# Patient Record
Sex: Female | Born: 1980 | Race: Black or African American | Hispanic: No | Marital: Single | Smoking: Never smoker
Health system: Southern US, Community
[De-identification: ages and names within clinical notes are randomized; demographics above are authoritative.]

## PROBLEM LIST (undated history)

## (undated) ENCOUNTER — Inpatient Hospital Stay (HOSPITAL_COMMUNITY): Payer: Self-pay

## (undated) DIAGNOSIS — F329 Major depressive disorder, single episode, unspecified: Secondary | ICD-10-CM

## (undated) DIAGNOSIS — M722 Plantar fascial fibromatosis: Secondary | ICD-10-CM

## (undated) DIAGNOSIS — Z9889 Other specified postprocedural states: Secondary | ICD-10-CM

## (undated) DIAGNOSIS — M549 Dorsalgia, unspecified: Secondary | ICD-10-CM

## (undated) DIAGNOSIS — T7840XA Allergy, unspecified, initial encounter: Secondary | ICD-10-CM

## (undated) DIAGNOSIS — F32A Depression, unspecified: Secondary | ICD-10-CM

## (undated) DIAGNOSIS — H9191 Unspecified hearing loss, right ear: Secondary | ICD-10-CM

## (undated) DIAGNOSIS — K469 Unspecified abdominal hernia without obstruction or gangrene: Secondary | ICD-10-CM

## (undated) DIAGNOSIS — E669 Obesity, unspecified: Secondary | ICD-10-CM

## (undated) DIAGNOSIS — G4730 Sleep apnea, unspecified: Secondary | ICD-10-CM

## (undated) DIAGNOSIS — R112 Nausea with vomiting, unspecified: Secondary | ICD-10-CM

## (undated) DIAGNOSIS — O24419 Gestational diabetes mellitus in pregnancy, unspecified control: Secondary | ICD-10-CM

## (undated) DIAGNOSIS — E119 Type 2 diabetes mellitus without complications: Secondary | ICD-10-CM

## (undated) DIAGNOSIS — K5900 Constipation, unspecified: Secondary | ICD-10-CM

## (undated) DIAGNOSIS — M2550 Pain in unspecified joint: Secondary | ICD-10-CM

## (undated) DIAGNOSIS — I10 Essential (primary) hypertension: Secondary | ICD-10-CM

## (undated) DIAGNOSIS — F419 Anxiety disorder, unspecified: Secondary | ICD-10-CM

## (undated) HISTORY — DX: Constipation, unspecified: K59.00

## (undated) HISTORY — DX: Type 2 diabetes mellitus without complications: E11.9

## (undated) HISTORY — DX: Depression, unspecified: F32.A

## (undated) HISTORY — DX: Anxiety disorder, unspecified: F41.9

## (undated) HISTORY — DX: Unspecified hearing loss, right ear: H91.91

## (undated) HISTORY — DX: Allergy, unspecified, initial encounter: T78.40XA

## (undated) HISTORY — DX: Morbid (severe) obesity due to excess calories: E66.01

## (undated) HISTORY — DX: Plantar fascial fibromatosis: M72.2

## (undated) HISTORY — PX: HERNIA REPAIR: SHX51

## (undated) HISTORY — DX: Major depressive disorder, single episode, unspecified: F32.9

## (undated) HISTORY — DX: Sleep apnea, unspecified: G47.30

## (undated) HISTORY — DX: Dorsalgia, unspecified: M54.9

## (undated) HISTORY — DX: Pain in unspecified joint: M25.50

## (undated) HISTORY — DX: Essential (primary) hypertension: I10

## (undated) HISTORY — DX: Obesity, unspecified: E66.9

## (undated) HISTORY — PX: ABDOMINAL HERNIA REPAIR: SHX539

---

## 2004-05-30 ENCOUNTER — Emergency Department (HOSPITAL_COMMUNITY): Admission: EM | Admit: 2004-05-30 | Discharge: 2004-05-30 | Payer: Self-pay | Admitting: Emergency Medicine

## 2004-06-01 ENCOUNTER — Emergency Department (HOSPITAL_COMMUNITY): Admission: EM | Admit: 2004-06-01 | Discharge: 2004-06-01 | Payer: Self-pay | Admitting: Family Medicine

## 2004-10-22 ENCOUNTER — Emergency Department (HOSPITAL_COMMUNITY): Admission: EM | Admit: 2004-10-22 | Discharge: 2004-10-22 | Payer: Self-pay | Admitting: Family Medicine

## 2006-11-12 ENCOUNTER — Inpatient Hospital Stay (HOSPITAL_COMMUNITY): Admission: AD | Admit: 2006-11-12 | Discharge: 2006-11-12 | Payer: Self-pay | Admitting: Obstetrics and Gynecology

## 2010-06-07 ENCOUNTER — Ambulatory Visit: Payer: Self-pay | Admitting: Nurse Practitioner

## 2010-06-07 ENCOUNTER — Inpatient Hospital Stay (HOSPITAL_COMMUNITY)
Admission: AD | Admit: 2010-06-07 | Discharge: 2010-06-07 | Payer: Self-pay | Source: Home / Self Care | Admitting: Obstetrics & Gynecology

## 2010-06-07 DIAGNOSIS — R109 Unspecified abdominal pain: Secondary | ICD-10-CM

## 2010-06-07 DIAGNOSIS — O2390 Unspecified genitourinary tract infection in pregnancy, unspecified trimester: Secondary | ICD-10-CM

## 2010-06-07 DIAGNOSIS — N390 Urinary tract infection, site not specified: Secondary | ICD-10-CM

## 2010-06-14 ENCOUNTER — Ambulatory Visit: Payer: Self-pay | Admitting: Obstetrics & Gynecology

## 2010-06-15 ENCOUNTER — Encounter: Payer: Self-pay | Admitting: Obstetrics and Gynecology

## 2010-06-15 LAB — CONVERTED CEMR LAB
Antibody Screen: NEGATIVE
Basophils Absolute: 0 10*3/uL (ref 0.0–0.1)
Basophils Relative: 0 % (ref 0–1)
Bilirubin Urine: NEGATIVE
Eosinophils Absolute: 0.1 10*3/uL (ref 0.0–0.7)
Eosinophils Relative: 1 % (ref 0–5)
HCT: 36 % (ref 36.0–46.0)
Hemoglobin, Urine: NEGATIVE
Hemoglobin: 11.7 g/dL — ABNORMAL LOW (ref 12.0–15.0)
Hepatitis B Surface Ag: NEGATIVE
Hgb A2 Quant: 2.8 % (ref 2.2–3.2)
Hgb A: 97.2 % (ref 96.8–97.8)
Hgb F Quant: 0 % (ref 0.0–2.0)
Hgb S Quant: 0 % (ref 0.0–0.0)
Ketones, ur: NEGATIVE mg/dL
Leukocytes, UA: NEGATIVE
Lymphocytes Relative: 16 % (ref 12–46)
Lymphs Abs: 3.1 10*3/uL (ref 0.7–4.0)
MCHC: 32.5 g/dL (ref 30.0–36.0)
MCV: 94.2 fL (ref 78.0–100.0)
Monocytes Absolute: 0.9 10*3/uL (ref 0.1–1.0)
Monocytes Relative: 5 % (ref 3–12)
Neutro Abs: 16.1 10*3/uL — ABNORMAL HIGH (ref 1.7–7.7)
Neutrophils Relative %: 79 % — ABNORMAL HIGH (ref 43–77)
Nitrite: NEGATIVE
Platelets: 330 10*3/uL (ref 150–400)
Protein, ur: NEGATIVE mg/dL
RBC: 3.82 M/uL — ABNORMAL LOW (ref 3.87–5.11)
RDW: 14.3 % (ref 11.5–15.5)
Rh Type: POSITIVE
Rubella: 207 intl units/mL — ABNORMAL HIGH
Specific Gravity, Urine: 1.023 (ref 1.005–1.030)
Urine Glucose: NEGATIVE mg/dL
Urobilinogen, UA: 0.2 (ref 0.0–1.0)
WBC: 20.3 10*3/uL — ABNORMAL HIGH (ref 4.0–10.5)
pH: 7 (ref 5.0–8.0)

## 2010-07-02 ENCOUNTER — Ambulatory Visit: Payer: Self-pay | Admitting: Physician Assistant

## 2010-07-30 ENCOUNTER — Ambulatory Visit: Payer: Self-pay | Admitting: Physician Assistant

## 2010-08-16 ENCOUNTER — Ambulatory Visit (HOSPITAL_COMMUNITY)
Admission: RE | Admit: 2010-08-16 | Discharge: 2010-08-16 | Payer: Self-pay | Source: Home / Self Care | Admitting: Obstetrics & Gynecology

## 2010-08-27 ENCOUNTER — Ambulatory Visit: Payer: Self-pay | Admitting: Obstetrics and Gynecology

## 2010-08-30 ENCOUNTER — Ambulatory Visit (HOSPITAL_COMMUNITY)
Admission: RE | Admit: 2010-08-30 | Discharge: 2010-08-30 | Payer: Self-pay | Source: Home / Self Care | Admitting: Family Medicine

## 2010-09-14 ENCOUNTER — Inpatient Hospital Stay (HOSPITAL_COMMUNITY)
Admission: AD | Admit: 2010-09-14 | Discharge: 2010-09-15 | Payer: Self-pay | Source: Home / Self Care | Attending: Family Medicine | Admitting: Family Medicine

## 2010-09-20 ENCOUNTER — Inpatient Hospital Stay (HOSPITAL_COMMUNITY)
Admission: AD | Admit: 2010-09-20 | Discharge: 2010-09-20 | Payer: Self-pay | Source: Home / Self Care | Attending: Obstetrics and Gynecology | Admitting: Obstetrics and Gynecology

## 2010-09-24 ENCOUNTER — Ambulatory Visit: Payer: Self-pay | Admitting: Obstetrics and Gynecology

## 2010-09-24 ENCOUNTER — Encounter: Payer: Self-pay | Admitting: Obstetrics and Gynecology

## 2010-10-01 ENCOUNTER — Encounter: Payer: Self-pay | Admitting: Obstetrics and Gynecology

## 2010-10-01 ENCOUNTER — Inpatient Hospital Stay (HOSPITAL_COMMUNITY)
Admission: AD | Admit: 2010-10-01 | Discharge: 2010-10-01 | Payer: Self-pay | Source: Home / Self Care | Attending: Family Medicine | Admitting: Family Medicine

## 2010-10-04 ENCOUNTER — Ambulatory Visit
Admission: RE | Admit: 2010-10-04 | Discharge: 2010-10-04 | Payer: Self-pay | Source: Home / Self Care | Attending: Physician Assistant | Admitting: Physician Assistant

## 2010-10-06 ENCOUNTER — Ambulatory Visit (HOSPITAL_COMMUNITY)
Admission: RE | Admit: 2010-10-06 | Discharge: 2010-10-06 | Payer: Self-pay | Source: Home / Self Care | Attending: Obstetrics & Gynecology | Admitting: Obstetrics & Gynecology

## 2010-10-11 ENCOUNTER — Encounter
Admission: RE | Admit: 2010-10-11 | Discharge: 2010-10-26 | Payer: Self-pay | Source: Home / Self Care | Attending: Obstetrics & Gynecology | Admitting: Obstetrics & Gynecology

## 2010-10-11 LAB — URINALYSIS, ROUTINE W REFLEX MICROSCOPIC
Bilirubin Urine: NEGATIVE
Hgb urine dipstick: NEGATIVE
Ketones, ur: NEGATIVE mg/dL
Nitrite: NEGATIVE
Protein, ur: NEGATIVE mg/dL
Specific Gravity, Urine: >1.03 — ABNORMAL HIGH (ref 1.005–1.030)
Urine Glucose, Fasting: NEGATIVE mg/dL
Urobilinogen, UA: 0.2 mg/dL (ref 0.0–1.0)
pH: 6 (ref 5.0–8.0)

## 2010-10-22 ENCOUNTER — Ambulatory Visit
Admission: RE | Admit: 2010-10-22 | Discharge: 2010-10-22 | Payer: Self-pay | Source: Home / Self Care | Attending: Family | Admitting: Family

## 2010-11-05 ENCOUNTER — Ambulatory Visit: Payer: Medicaid Other | Admitting: Physical Therapy

## 2010-11-05 ENCOUNTER — Encounter: Payer: Self-pay | Admitting: Family

## 2010-11-05 DIAGNOSIS — Z3480 Encounter for supervision of other normal pregnancy, unspecified trimester: Secondary | ICD-10-CM

## 2010-11-05 LAB — CONVERTED CEMR LAB
HCT: 32.2 % — ABNORMAL LOW (ref 36.0–46.0)
HIV: NONREACTIVE
Hemoglobin: 10.5 g/dL — ABNORMAL LOW (ref 12.0–15.0)
MCHC: 32.6 g/dL (ref 30.0–36.0)
MCV: 93.3 fL (ref 78.0–100.0)
Platelets: 239 10*3/uL (ref 150–400)
RBC: 3.45 M/uL — ABNORMAL LOW (ref 3.87–5.11)
RDW: 13.8 % (ref 11.5–15.5)
WBC: 17.2 10*3/uL — ABNORMAL HIGH (ref 4.0–10.5)

## 2010-11-08 ENCOUNTER — Ambulatory Visit: Payer: Medicaid Other | Attending: Obstetrics & Gynecology | Admitting: Physical Therapy

## 2010-11-08 ENCOUNTER — Encounter: Payer: Medicaid Other | Admitting: Physical Therapy

## 2010-11-08 DIAGNOSIS — M25559 Pain in unspecified hip: Secondary | ICD-10-CM | POA: Insufficient documentation

## 2010-11-08 DIAGNOSIS — IMO0001 Reserved for inherently not codable concepts without codable children: Secondary | ICD-10-CM | POA: Insufficient documentation

## 2010-11-08 DIAGNOSIS — M6281 Muscle weakness (generalized): Secondary | ICD-10-CM | POA: Insufficient documentation

## 2010-11-08 DIAGNOSIS — R262 Difficulty in walking, not elsewhere classified: Secondary | ICD-10-CM | POA: Insufficient documentation

## 2010-11-19 ENCOUNTER — Other Ambulatory Visit: Payer: Self-pay | Admitting: Obstetrics & Gynecology

## 2010-11-19 DIAGNOSIS — IMO0002 Reserved for concepts with insufficient information to code with codable children: Secondary | ICD-10-CM

## 2010-11-19 DIAGNOSIS — Z3689 Encounter for other specified antenatal screening: Secondary | ICD-10-CM

## 2010-11-19 DIAGNOSIS — Z3480 Encounter for supervision of other normal pregnancy, unspecified trimester: Secondary | ICD-10-CM

## 2010-11-22 ENCOUNTER — Ambulatory Visit (HOSPITAL_COMMUNITY): Payer: Medicaid Other

## 2010-11-22 ENCOUNTER — Encounter (HOSPITAL_COMMUNITY): Payer: Self-pay

## 2010-11-22 ENCOUNTER — Ambulatory Visit (HOSPITAL_COMMUNITY)
Admission: RE | Admit: 2010-11-22 | Discharge: 2010-11-22 | Disposition: A | Discharge status: Home / Self Care | Payer: Medicaid Other | Source: Ambulatory Visit | Attending: Obstetrics & Gynecology | Admitting: Obstetrics & Gynecology

## 2010-11-22 DIAGNOSIS — E669 Obesity, unspecified: Secondary | ICD-10-CM | POA: Insufficient documentation

## 2010-11-22 DIAGNOSIS — Z3689 Encounter for other specified antenatal screening: Secondary | ICD-10-CM

## 2010-11-22 DIAGNOSIS — O99210 Obesity complicating pregnancy, unspecified trimester: Secondary | ICD-10-CM | POA: Insufficient documentation

## 2010-11-22 DIAGNOSIS — O3660X0 Maternal care for excessive fetal growth, unspecified trimester, not applicable or unspecified: Secondary | ICD-10-CM | POA: Insufficient documentation

## 2010-11-26 ENCOUNTER — Encounter: Payer: Medicaid Other | Attending: Obstetrics & Gynecology | Admitting: Dietician

## 2010-11-26 DIAGNOSIS — Z713 Dietary counseling and surveillance: Secondary | ICD-10-CM | POA: Insufficient documentation

## 2010-12-03 DIAGNOSIS — R7309 Other abnormal glucose: Secondary | ICD-10-CM

## 2010-12-03 DIAGNOSIS — Z3480 Encounter for supervision of other normal pregnancy, unspecified trimester: Secondary | ICD-10-CM

## 2010-12-03 DIAGNOSIS — IMO0002 Reserved for concepts with insufficient information to code with codable children: Secondary | ICD-10-CM

## 2010-12-03 DIAGNOSIS — O3440 Maternal care for other abnormalities of cervix, unspecified trimester: Secondary | ICD-10-CM

## 2010-12-06 LAB — URINALYSIS, ROUTINE W REFLEX MICROSCOPIC
Bilirubin Urine: NEGATIVE
Glucose, UA: NEGATIVE mg/dL
Hgb urine dipstick: NEGATIVE
Ketones, ur: NEGATIVE mg/dL
Nitrite: NEGATIVE
Protein, ur: NEGATIVE mg/dL
Specific Gravity, Urine: >1.03 — ABNORMAL HIGH (ref 1.005–1.030)
Urobilinogen, UA: 1 mg/dL (ref 0.0–1.0)
pH: 6 (ref 5.0–8.0)

## 2010-12-09 LAB — URINALYSIS, ROUTINE W REFLEX MICROSCOPIC
Bilirubin Urine: NEGATIVE
Glucose, UA: NEGATIVE mg/dL
Hgb urine dipstick: NEGATIVE
Ketones, ur: NEGATIVE mg/dL
Nitrite: NEGATIVE
Protein, ur: NEGATIVE mg/dL
Specific Gravity, Urine: >1.03 — ABNORMAL HIGH (ref 1.005–1.030)
Urobilinogen, UA: 0.2 mg/dL (ref 0.0–1.0)
pH: 6 (ref 5.0–8.0)

## 2010-12-09 LAB — CBC
HCT: 36.8 % (ref 36.0–46.0)
Hemoglobin: 12.2 g/dL (ref 12.0–15.0)
MCH: 31.5 pg (ref 26.0–34.0)
MCHC: 33.2 g/dL (ref 30.0–36.0)
MCV: 94.8 fL (ref 78.0–100.0)
Platelets: 304 10*3/uL (ref 150–400)
RBC: 3.88 MIL/uL (ref 3.87–5.11)
RDW: 13.4 % (ref 11.5–15.5)
WBC: 21.1 10*3/uL — ABNORMAL HIGH (ref 4.0–10.5)

## 2010-12-09 LAB — WET PREP, GENITAL
Trich, Wet Prep: NONE SEEN
Yeast Wet Prep HPF POC: NONE SEEN

## 2010-12-09 LAB — DIFFERENTIAL
Basophils Absolute: 0 10*3/uL (ref 0.0–0.1)
Basophils Relative: 0 % (ref 0–1)
Eosinophils Absolute: 0.1 10*3/uL (ref 0.0–0.7)
Eosinophils Relative: 1 % (ref 0–5)
Lymphocytes Relative: 17 % (ref 12–46)
Lymphs Abs: 3.5 10*3/uL (ref 0.7–4.0)
Monocytes Absolute: 1.4 10*3/uL — ABNORMAL HIGH (ref 0.1–1.0)
Monocytes Relative: 7 % (ref 3–12)
Neutro Abs: 15.9 10*3/uL — ABNORMAL HIGH (ref 1.7–7.7)
Neutrophils Relative %: 76 % (ref 43–77)

## 2010-12-09 LAB — URINE CULTURE
Colony Count: 85000
Culture  Setup Time: 201109130217

## 2010-12-09 LAB — HCG, QUANTITATIVE, PREGNANCY: hCG, Beta Chain, Quant, S: 81478 m[IU]/mL — ABNORMAL HIGH (ref <5)

## 2010-12-09 LAB — POCT PREGNANCY, URINE: Preg Test, Ur: POSITIVE

## 2010-12-09 LAB — URINE MICROSCOPIC-ADD ON

## 2010-12-09 LAB — GC/CHLAMYDIA PROBE AMP, GENITAL
Chlamydia, DNA Probe: NEGATIVE
GC Probe Amp, Genital: NEGATIVE

## 2010-12-17 ENCOUNTER — Ambulatory Visit (HOSPITAL_BASED_OUTPATIENT_CLINIC_OR_DEPARTMENT_OTHER)
Admission: RE | Admit: 2010-12-17 | Discharge: 2010-12-17 | Disposition: A | Discharge status: Home / Self Care | Payer: Medicaid Other | Source: Ambulatory Visit | Attending: Obstetrics & Gynecology | Admitting: Obstetrics & Gynecology

## 2010-12-17 ENCOUNTER — Other Ambulatory Visit: Payer: Self-pay | Admitting: Obstetrics & Gynecology

## 2010-12-17 ENCOUNTER — Encounter: Payer: Self-pay | Admitting: Family

## 2010-12-17 DIAGNOSIS — R7309 Other abnormal glucose: Secondary | ICD-10-CM

## 2010-12-17 DIAGNOSIS — M7989 Other specified soft tissue disorders: Secondary | ICD-10-CM

## 2010-12-17 DIAGNOSIS — O99891 Other specified diseases and conditions complicating pregnancy: Secondary | ICD-10-CM | POA: Insufficient documentation

## 2010-12-17 DIAGNOSIS — R252 Cramp and spasm: Secondary | ICD-10-CM | POA: Insufficient documentation

## 2010-12-17 DIAGNOSIS — Z3480 Encounter for supervision of other normal pregnancy, unspecified trimester: Secondary | ICD-10-CM

## 2010-12-17 DIAGNOSIS — Z331 Pregnant state, incidental: Secondary | ICD-10-CM

## 2010-12-17 DIAGNOSIS — M79604 Pain in right leg: Secondary | ICD-10-CM

## 2010-12-17 DIAGNOSIS — M79609 Pain in unspecified limb: Secondary | ICD-10-CM

## 2010-12-17 DIAGNOSIS — I82409 Acute embolism and thrombosis of unspecified deep veins of unspecified lower extremity: Secondary | ICD-10-CM

## 2010-12-17 LAB — CONVERTED CEMR LAB
Chlamydia, Swab/Urine, PCR: NEGATIVE
GC Probe Amp, Urine: NEGATIVE

## 2010-12-18 ENCOUNTER — Encounter: Payer: Self-pay | Admitting: Family

## 2010-12-24 ENCOUNTER — Other Ambulatory Visit: Payer: Self-pay | Admitting: Obstetrics & Gynecology

## 2010-12-24 DIAGNOSIS — Z3480 Encounter for supervision of other normal pregnancy, unspecified trimester: Secondary | ICD-10-CM

## 2010-12-24 DIAGNOSIS — O26849 Uterine size-date discrepancy, unspecified trimester: Secondary | ICD-10-CM

## 2010-12-28 ENCOUNTER — Ambulatory Visit (HOSPITAL_COMMUNITY)
Admission: RE | Admit: 2010-12-28 | Discharge: 2010-12-28 | Disposition: A | Discharge status: Home / Self Care | Payer: Medicaid Other | Source: Ambulatory Visit | Attending: Obstetrics & Gynecology | Admitting: Obstetrics & Gynecology

## 2010-12-28 DIAGNOSIS — O3660X0 Maternal care for excessive fetal growth, unspecified trimester, not applicable or unspecified: Secondary | ICD-10-CM | POA: Insufficient documentation

## 2010-12-28 DIAGNOSIS — E669 Obesity, unspecified: Secondary | ICD-10-CM | POA: Insufficient documentation

## 2010-12-28 DIAGNOSIS — O99210 Obesity complicating pregnancy, unspecified trimester: Secondary | ICD-10-CM | POA: Insufficient documentation

## 2010-12-28 DIAGNOSIS — O26849 Uterine size-date discrepancy, unspecified trimester: Secondary | ICD-10-CM

## 2011-01-02 ENCOUNTER — Inpatient Hospital Stay (HOSPITAL_COMMUNITY)
Admission: AD | Admit: 2011-01-02 | Discharge: 2011-01-02 | Disposition: A | Discharge status: Home / Self Care | Payer: Medicaid Other | Source: Ambulatory Visit | Attending: Obstetrics & Gynecology | Admitting: Obstetrics & Gynecology

## 2011-01-02 DIAGNOSIS — O99891 Other specified diseases and conditions complicating pregnancy: Secondary | ICD-10-CM | POA: Insufficient documentation

## 2011-01-03 ENCOUNTER — Other Ambulatory Visit: Payer: Self-pay | Admitting: Obstetrics & Gynecology

## 2011-01-03 DIAGNOSIS — Z3480 Encounter for supervision of other normal pregnancy, unspecified trimester: Secondary | ICD-10-CM

## 2011-01-03 DIAGNOSIS — O26849 Uterine size-date discrepancy, unspecified trimester: Secondary | ICD-10-CM

## 2011-01-03 DIAGNOSIS — IMO0002 Reserved for concepts with insufficient information to code with codable children: Secondary | ICD-10-CM

## 2011-01-03 DIAGNOSIS — R7309 Other abnormal glucose: Secondary | ICD-10-CM

## 2011-01-10 DIAGNOSIS — IMO0002 Reserved for concepts with insufficient information to code with codable children: Secondary | ICD-10-CM

## 2011-01-10 DIAGNOSIS — R7309 Other abnormal glucose: Secondary | ICD-10-CM

## 2011-01-10 DIAGNOSIS — Z3480 Encounter for supervision of other normal pregnancy, unspecified trimester: Secondary | ICD-10-CM

## 2011-01-12 ENCOUNTER — Ambulatory Visit (HOSPITAL_COMMUNITY): Payer: Medicaid Other

## 2011-01-13 ENCOUNTER — Inpatient Hospital Stay (HOSPITAL_COMMUNITY): Payer: Medicaid Other

## 2011-01-13 ENCOUNTER — Inpatient Hospital Stay (HOSPITAL_COMMUNITY)
Admission: AD | Admit: 2011-01-13 | Discharge: 2011-01-17 | DRG: 765 | Disposition: A | Discharge status: Home / Self Care | Payer: Medicaid Other | Source: Ambulatory Visit | Attending: Obstetrics & Gynecology | Admitting: Obstetrics & Gynecology

## 2011-01-13 ENCOUNTER — Other Ambulatory Visit (HOSPITAL_COMMUNITY): Payer: Medicaid Other

## 2011-01-13 DIAGNOSIS — O411090 Infection of amniotic sac and membranes, unspecified, unspecified trimester, not applicable or unspecified: Secondary | ICD-10-CM | POA: Diagnosis present

## 2011-01-13 DIAGNOSIS — O4290 Premature rupture of membranes, unspecified as to length of time between rupture and onset of labor, unspecified weeks of gestation: Principal | ICD-10-CM | POA: Diagnosis present

## 2011-01-13 LAB — CBC
MCH: 30.8 pg (ref 26.0–34.0)
MCHC: 33.1 g/dL (ref 30.0–36.0)
MCV: 93 fL (ref 78.0–100.0)
Platelets: 207 10*3/uL (ref 150–400)
RBC: 4 MIL/uL (ref 3.87–5.11)
RDW: 14.6 % (ref 11.5–15.5)

## 2011-01-13 LAB — RPR: RPR Ser Ql: NONREACTIVE

## 2011-01-14 DIAGNOSIS — O411090 Infection of amniotic sac and membranes, unspecified, unspecified trimester, not applicable or unspecified: Secondary | ICD-10-CM

## 2011-01-14 DIAGNOSIS — O4290 Premature rupture of membranes, unspecified as to length of time between rupture and onset of labor, unspecified weeks of gestation: Secondary | ICD-10-CM

## 2011-01-15 LAB — CBC
Hemoglobin: 9.9 g/dL — ABNORMAL LOW (ref 12.0–15.0)
MCH: 29.9 pg (ref 26.0–34.0)
MCHC: 32.1 g/dL (ref 30.0–36.0)
RDW: 14.7 % (ref 11.5–15.5)

## 2011-01-16 LAB — CREATININE, SERUM: GFR calc Af Amer: >60 mL/min (ref >60)

## 2011-01-18 ENCOUNTER — Ambulatory Visit (HOSPITAL_COMMUNITY): Payer: Medicaid Other

## 2011-01-19 NOTE — Op Note (Addendum)
NAME:  [name], [name]              ACCOUNT NO.:  [id number]  MEDICAL RECORD NO.:  [id number]           PATIENT TYPE:  I  LOCATION:  9101                          FACILITY:  [facility]  PHYSICIAN:  Catalina Antigua, MD     DATE OF BIRTH:  31-Jan-1981  DATE OF PROCEDURE:  01/14/2011 DATE OF DISCHARGE:                              OPERATIVE REPORT   PREOPERATIVE DIAGNOSIS:  This is a 30 year old gravida 1, para 0 at 73 and 3 who had a premature prolonged rupture of membranes greater than 24 hours and noted to have protracted labor.  She had a tracing category 2 with late decels resolved with IV hydration, maternal repositioning, and stopping Pitocin.  Delivery via C-section was discussed in view of category 2 tracing and protracted labor and the patient agreed with proceeding.  POSTOPERATIVE DIAGNOSES:  This is a 30 year old gravida 1, para 0 at 57 and 3 who had a premature prolonged rupture of membranes greater than 24 hours and noted to have protracted labor.  She had a tracing category 2 with late decels resolved with IV hydration, maternal repositioning, and stopping Pitocin.  Delivery via C-section was discussed in view of category 2 tracing and protracted labor and the patient agreed with proceeding.  PROCEDURE:  Primary low transverse cesarean section.  SURGEON:  Dr. Catalina Antigua, Dr. Lucina Mellow.  ASSISTANT:  None.  ANESTHESIA:  Epidural.  COMPLICATIONS:  None.  ESTIMATED BLOOD LOSS:  800 mL.  FLUIDS:  2500 mL.  URINE OUTPUT:  150 mL and clear.  FINDINGS:  Viable infant female in cephalic presentation in acyclinic position, Apgar's of 9 and 9, 8 pounds and 8 ounces with delayed, normal placenta, uterus, ovaries, and fallopian tubes.  PROCEDURE:  The patient was taken to the operating room where epidural anesthesia was found to be adequate.  She was prepared and draped in normal sterile fashion in the dorsal supine position with a leftward tilt.  A Pfannenstiel soft  skin incision was then made with a scalpel and carried through to the underlying fascia with the Bovie and the knife.  The fascia was incised in the midline and the excision was extended laterally with Mayo scissors.  The superior aspect of the incision was grasped with Kocher clamps, elevated, and the underlying rectus muscles were dissected off bluntly.  Attention was then turned to the inferior aspect of the incision which in similar fashion was grasped, tented up with Kocher clamps, and rectus muscles were dissected off bluntly.  The rectus muscles were separated in the midline.  The peritoneum was identified and entered sharply and bluntly with fingers and hemostats.  The peritoneal incision and rectus muscles were then separated with strong lateral traction with good visualization of the uterus.  The Alexis retractor was placed and the lower uterine segment was incised in a transverse fashion with the scalpel.  It was then extended in an upward lateral fashion with bandage scissors.  The infant's head was delivered atraumatically.  The nose and mouth were suctioned on the field and the cord was clamped and cut.  Cord blood gas was attempted to be obtained but  could not be obtained and this was stopped because the infant was crying strong.  Cord blood sample was obtained as well as the cord blood donation samples obtained and handed off.  The placenta delivered spontaneously and was passed off and was sent to labor and delivery.  The uterus was cleared of all clots and debris and uterus was repaired with Vicryl in a running locked fashion.  The second layer of the same suture in imbricating fashion was used to obtain excellent hemostasis. The abdomen was irrigated and was checked for any bleeding which was not noted.  The peritoneum was then closed with 3-0 Vicryl in a running stitch.  The fascia was reapproximated with 0 Vicryl in a running fashion.  The skin was closed with  staples.  The patient tolerated the procedure well.  Sponge, lap, and needle counts were correct x2.  The patient may be given amp and gent during labor for chorioamnionitis and was given Ancef prior to the procedure.  The patient was taken to the recovery room in stable condition.    ______________________________ Lucina Mellow, DO   ______________________________ Catalina Antigua, MD    SH/MEDQ  D:  01/14/2011  T:  01/15/2011  Job:  [id number]  Electronically Signed by Lucina Mellow MD on 01/19/2011 03:58:23 PM Electronically Signed by Catalina Antigua  on 01/19/2011 05:07:30 PM

## 2011-01-21 ENCOUNTER — Encounter (INDEPENDENT_AMBULATORY_CARE_PROVIDER_SITE_OTHER): Payer: Medicaid Other

## 2011-01-21 DIAGNOSIS — Z09 Encounter for follow-up examination after completed treatment for conditions other than malignant neoplasm: Secondary | ICD-10-CM

## 2011-01-24 ENCOUNTER — Ambulatory Visit (HOSPITAL_COMMUNITY): Payer: Medicaid Other

## 2011-02-08 NOTE — Discharge Summary (Signed)
  [name], [name]              ACCOUNT NO.:  [id number]  MEDICAL RECORD NO.:  [id number]           PATIENT TYPE:  I  LOCATION:  9101                          [facility]  [facility]  PHYSICIAN:  Catalina Antigua, MD     DATE OF BIRTH:  1981-03-30  DATE OF ADMISSION:  01/13/2011 DATE OF DISCHARGE:  01/17/2011                              DISCHARGE SUMMARY   REASON FOR ADMISSION:  Pregnancy at term and early labor.  PROCEDURE:  The patient had a low transverse cesarean section for arrest of dilatation and descent plus nonreassuring fetal heart rate tones with chorioamnionitis, premature rupture of membranes greater than 36 hours, by Dr. Jolayne Panther and Natale Milch, which produced a viable female infant with Apgars of 9 and 9 weighing 8 pounds 8 ounces.  POSTOPERATIVE DIAGNOSIS:  The patient had a low transverse cesarean section for arrest of dilatation and descent plus nonreassuring fetal heart rate tones with chorioamnionitis, premature rupture of membranes greater than 36 hours, by Dr. Jolayne Panther and Natale Milch, which produced a viable female infant with Apgars of 9 and 9 weighing 8 pounds 8 ounces.  HOSPITAL COURSE:  This has been uneventful.  The patient is up ambulating well, taking p.o. fluids and solids well.  She responded well to the IV therapy.  Prior to delivery in regard to maternal temp with chorioamnionitis, IV antibiotics were continued for 24 hours post delivery.  The patient has remained afebrile.  Discharge diet as tolerated.  ACTIVITY LEVEL:  No heavy lifting or driving for 2 weeks.  FOLLOWUP:  She is to follow up at [facility] on Friday for staple removal and blood pressure check.  PHYSICAL EXAMINATION TODAY:  VITAL SIGNS:  Stable. HEART:  Regular rhythm and rate. LUNGS:  Clear to auscultation bilaterally. ABDOMEN:  Soft.  Bowel sounds present in all four quadrants.  Incision is intact.  There is no redness, swelling, or drainage.  Trace edema. Fundus is  firm. GENITOURINARY:  Lochia is small amount.  ASSESSMENT:  Stable postop day 3.  DISCHARGE MEDICATIONS: 1. FeSO4 - 325 one p.o. q.a.m. 2. Prenatal vitamin one daily. 3. Percocet 5/325 one p.o. q.4 h. pain. 4. Motrin 600 p.o. q.6 h. p.r.n. cramping.  Discharge hemoglobin is 10.  Discharge hematocrit is 31.     Zerita Boers, N.M.   ______________________________ Catalina Antigua, MD    DL/MEDQ  D:  16/06/9603  T:  01/17/2011  Job:  [id number]  cc:   [facility]  Electronically Signed by Wyvonnia Dusky N.M. on 01/30/2011 08:59:15 AM Electronically Signed by Catalina Antigua  on 02/08/2011 06:24:37 PM

## 2011-02-24 ENCOUNTER — Ambulatory Visit (HOSPITAL_COMMUNITY)
Admission: RE | Admit: 2011-02-24 | Discharge: 2011-02-24 | Disposition: A | Discharge status: Home / Self Care | Payer: Medicaid Other | Source: Ambulatory Visit | Attending: Obstetrics & Gynecology | Admitting: Obstetrics & Gynecology

## 2011-02-25 ENCOUNTER — Ambulatory Visit (INDEPENDENT_AMBULATORY_CARE_PROVIDER_SITE_OTHER): Payer: Medicaid Other

## 2011-02-25 DIAGNOSIS — Z3049 Encounter for surveillance of other contraceptives: Secondary | ICD-10-CM

## 2011-03-02 ENCOUNTER — Ambulatory Visit (HOSPITAL_COMMUNITY)
Admission: RE | Admit: 2011-03-02 | Discharge: 2011-03-02 | Disposition: A | Discharge status: Home / Self Care | Payer: Medicaid Other | Source: Ambulatory Visit | Attending: Obstetrics & Gynecology | Admitting: Obstetrics & Gynecology

## 2011-03-02 DIAGNOSIS — O925 Suppressed lactation: Secondary | ICD-10-CM | POA: Insufficient documentation

## 2011-03-08 ENCOUNTER — Ambulatory Visit (HOSPITAL_COMMUNITY)
Admission: RE | Admit: 2011-03-08 | Discharge: 2011-03-08 | Disposition: A | Discharge status: Home / Self Care | Payer: Medicaid Other | Source: Ambulatory Visit | Attending: Obstetrics & Gynecology | Admitting: Obstetrics & Gynecology

## 2011-03-08 DIAGNOSIS — O925 Suppressed lactation: Secondary | ICD-10-CM | POA: Insufficient documentation

## 2011-03-08 DIAGNOSIS — O923 Agalactia: Secondary | ICD-10-CM | POA: Insufficient documentation

## 2011-03-15 ENCOUNTER — Ambulatory Visit (HOSPITAL_COMMUNITY)
Admission: RE | Admit: 2011-03-15 | Discharge: 2011-03-15 | Disposition: A | Discharge status: Home / Self Care | Payer: Medicaid Other | Source: Ambulatory Visit | Attending: Obstetrics & Gynecology | Admitting: Obstetrics & Gynecology

## 2011-03-29 NOTE — Group Therapy Note (Unsigned)
[name], [name]              ACCOUNT NO.:  [id number]  MEDICAL RECORD NO.:  [id number]           PATIENT TYPE:  O  LOCATION:  [facility]                    [facility]  [facility]  PHYSICIAN:  Georges Mouse, CNM   DATE OF BIRTH:  [birth year]  DATE OF SERVICE:  03/28/2011                                 CLINIC NOTE  REASON FOR VISIT:  6-week postpartum visit.  HISTORY OF PRESENT ILLNESS:  [name] is a G1, P1 here for a 6-week postpartum visit.  She is status post primary C-section on January 14, 2011.  She is without complaints today.  She is bottle and breastfeeding without any problems.  She is requesting Depo-Provera for birth control. She states that her vaginal bleeding has stopped.  She does have ongoing pain in her incision site, which is controlled with ibuprofen.  PAST MEDICAL HISTORY:  No change since history taken at initial prenatal visit on July 02, 2010.  Last Pap smear was October 2011, and it was normal.  MEDICATIONS:  Colace as needed, prenatal vitamins, and ibuprofen as needed.  ALLERGIES:  NKDA.  OBJECTIVE:  VITAL SIGNS:  Pulse 83, blood pressure 114/73, weight 236 pounds, height is 5 feet 6 inches. GENERAL:  Well-appearing, obese female, in no acute distress. ABDOMEN:  Soft and nontender.  C-section, incision well healed with no erythema, induration, or exudate. PELVIS:  Pannus is below symphysis pubis. PSYCH:  Mood appropriate.  A and O x4. BREASTS:  Soft, nontender.  No masses.  No erythema. SKIN:  Intact nipples.  ASSESSMENT:  A 30 year old G1, P1, 6-weeks postpartum, normal postpartum course.  PLAN:  Depo-Provera for birth control was prior to administration. Discussed risks and benefits of Depo as well as other birth control options.  The patient was interested in Implanon, but chose to get a Depo-Provera shot today.  She will follow up and she desires to have Implanon inserted.  Her urine pregnancy test was negative today. Counseled on  breastfeeding.  The patient will follow up for annual exams and repeat Pap smear as indicated.          ______________________________ Georges Mouse, CNM    NF/MEDQ  D:  03/28/2011  T:  03/28/2011  Job:  [id number]

## 2011-04-29 ENCOUNTER — Telehealth: Payer: Self-pay | Admitting: *Deleted

## 2011-04-29 DIAGNOSIS — B3789 Other sites of candidiasis: Secondary | ICD-10-CM

## 2011-04-29 MED ORDER — DICYCLOMINE HCL 10 MG PO CAPS: 10.0000 mg | ORAL_CAPSULE | Freq: Three times a day (TID) | ORAL | Status: DC

## 2011-04-29 NOTE — Telephone Encounter (Signed)
Pt having decreased breast milk production.  Spoke with Maylon Cos, CNM and ordered Bentyl 10mg  #90 one TID with one RF.  This was called to [facility] on [address].  Pt to call with followup of milk supply.

## 2011-05-13 ENCOUNTER — Ambulatory Visit (INDEPENDENT_AMBULATORY_CARE_PROVIDER_SITE_OTHER): Payer: Medicaid Other | Admitting: *Deleted

## 2011-05-13 ENCOUNTER — Encounter: Payer: Self-pay | Admitting: *Deleted

## 2011-05-13 VITALS — BP 121/76 | HR 87 | Temp 98.3°F | Resp 16 | Ht 66.0 in | Wt 246.0 lb

## 2011-05-13 DIAGNOSIS — Z3049 Encounter for surveillance of other contraceptives: Secondary | ICD-10-CM

## 2011-05-13 MED ORDER — MEDROXYPROGESTERONE ACETATE 150 MG/ML IM SUSP
150.0000 mg | Freq: Once | INTRAMUSCULAR | Status: AC
  Administered 2011-05-13: 150 mg via INTRAMUSCULAR

## 2011-05-13 MED ORDER — MEDROXYPROGESTERONE ACETATE 150 MG/ML IM SUSP: 150.0000 mg | Freq: Once | INTRAMUSCULAR | Status: DC

## 2011-06-20 ENCOUNTER — Emergency Department (HOSPITAL_COMMUNITY): Payer: Self-pay

## 2011-06-20 ENCOUNTER — Emergency Department (HOSPITAL_COMMUNITY)
Admission: EM | Admit: 2011-06-20 | Discharge: 2011-06-20 | Disposition: A | Discharge status: Home / Self Care | Payer: Self-pay | Attending: Emergency Medicine | Admitting: Emergency Medicine

## 2011-06-20 DIAGNOSIS — R05 Cough: Secondary | ICD-10-CM | POA: Insufficient documentation

## 2011-06-20 DIAGNOSIS — R059 Cough, unspecified: Secondary | ICD-10-CM | POA: Insufficient documentation

## 2011-06-20 DIAGNOSIS — R0602 Shortness of breath: Secondary | ICD-10-CM | POA: Insufficient documentation

## 2011-06-20 DIAGNOSIS — J40 Bronchitis, not specified as acute or chronic: Secondary | ICD-10-CM | POA: Insufficient documentation

## 2011-07-29 ENCOUNTER — Ambulatory Visit: Payer: Self-pay

## 2011-08-04 ENCOUNTER — Ambulatory Visit (INDEPENDENT_AMBULATORY_CARE_PROVIDER_SITE_OTHER): Payer: Medicaid Other | Admitting: *Deleted

## 2011-08-04 VITALS — BP 124/78 | HR 70 | Temp 98.5°F | Resp 16 | Ht 65.0 in | Wt 262.0 lb

## 2011-08-04 DIAGNOSIS — Z3049 Encounter for surveillance of other contraceptives: Secondary | ICD-10-CM

## 2011-08-04 DIAGNOSIS — Z3040 Encounter for surveillance of contraceptives, unspecified: Secondary | ICD-10-CM

## 2011-08-04 MED ORDER — MEDROXYPROGESTERONE ACETATE 150 MG/ML IM SUSP
150.0000 mg | INTRAMUSCULAR | Status: DC
  Administered 2011-08-04 – 2012-01-09 (×3): 150 mg via INTRAMUSCULAR

## 2011-10-20 ENCOUNTER — Ambulatory Visit (INDEPENDENT_AMBULATORY_CARE_PROVIDER_SITE_OTHER): Payer: Medicaid Other | Admitting: *Deleted

## 2011-10-20 VITALS — BP 131/82 | HR 93 | Temp 98.5°F | Resp 17 | Ht 65.0 in | Wt 254.0 lb

## 2011-10-20 DIAGNOSIS — Z3049 Encounter for surveillance of other contraceptives: Secondary | ICD-10-CM

## 2011-10-20 NOTE — Progress Notes (Signed)
Pt here for Depo Provera 150mg  injection given RUOQ IM per dictated note.

## 2012-01-05 ENCOUNTER — Ambulatory Visit: Payer: Medicaid Other | Admitting: *Deleted

## 2012-01-09 ENCOUNTER — Other Ambulatory Visit (HOSPITAL_COMMUNITY)
Admission: RE | Admit: 2012-01-09 | Discharge: 2012-01-09 | Disposition: A | Discharge status: Home / Self Care | Payer: Medicaid Other | Source: Ambulatory Visit | Attending: Obstetrics & Gynecology | Admitting: Obstetrics & Gynecology

## 2012-01-09 ENCOUNTER — Ambulatory Visit (INDEPENDENT_AMBULATORY_CARE_PROVIDER_SITE_OTHER): Payer: Medicaid Other | Admitting: Obstetrics & Gynecology

## 2012-01-09 ENCOUNTER — Encounter: Payer: Self-pay | Admitting: Obstetrics & Gynecology

## 2012-01-09 VITALS — BP 127/83 | HR 102 | Temp 98.0°F | Resp 17 | Ht 66.0 in | Wt 262.0 lb

## 2012-01-09 DIAGNOSIS — Z01419 Encounter for gynecological examination (general) (routine) without abnormal findings: Secondary | ICD-10-CM | POA: Insufficient documentation

## 2012-01-09 DIAGNOSIS — Z3049 Encounter for surveillance of other contraceptives: Secondary | ICD-10-CM

## 2012-01-09 DIAGNOSIS — Z0000 Encounter for general adult medical examination without abnormal findings: Secondary | ICD-10-CM

## 2012-01-09 DIAGNOSIS — Z113 Encounter for screening for infections with a predominantly sexual mode of transmission: Secondary | ICD-10-CM | POA: Insufficient documentation

## 2012-01-09 NOTE — Progress Notes (Signed)
Subjective:    [name] is a 31 y.o. female who presents for an annual exam. She wants her depo provera today but wants to talk about other options for the future. The patient is sexually active. GYN screening history: last pap: was normal. The patient wears seatbelts: yes. The patient participates in regular exercise: no. Has the patient ever been transfused or tattooed?: not asked. The patient reports that there is not domestic violence in her life.   Menstrual History: OB History    Grav Para Term Preterm Abortions TAB SAB Ect Mult Living   1 1 1       1       Menarche age: 77 No LMP recorded. Patient has had an injection.    The following portions of the patient's history were reviewed and updated as appropriate: allergies, current medications, past family history, past medical history, past social history, past surgical history and problem list.  Review of Systems A comprehensive review of systems was negative.    Objective:    BP 127/83  Pulse 102  Temp(Src) 98 F (36.7 C) (Oral)  Resp 17  Ht 5\' 6"  (1.676 m)  Wt 262 lb (118.842 kg)  BMI 42.29 kg/m2  General Appearance:    Alert, cooperative, no distress, appears stated age  Head:    Normocephalic, without obvious abnormality, atraumatic  Eyes:    PERRL, conjunctiva/corneas clear, EOM's intact, fundi    benign, both eyes  Ears:    Normal TM's and external ear canals, both ears  Nose:   Nares normal, septum midline, mucosa normal, no drainage    or sinus tenderness  Throat:   Lips, mucosa, and tongue normal; teeth and gums normal  Neck:   Supple, symmetrical, trachea midline, no adenopathy;    thyroid:  no enlargement/tenderness/nodules; no carotid   bruit or JVD  Back:     Symmetric, no curvature, ROM normal, no CVA tenderness  Lungs:     Clear to auscultation bilaterally, respirations unlabored  Chest Wall:    No tenderness or deformity   Heart:    Regular rate and rhythm, S1 and S2 normal, no murmur, rub   or  gallop  Breast Exam:    No tenderness, masses, or nipple abnormality  Abdomen:     Soft, non-tender, bowel sounds active all four quadrants,    no masses, no organomegaly  Genitalia:    Normal female without lesion, discharge or tenderness, NSS mid plane, NT, no adnexal masses     Extremities:   Extremities normal, atraumatic, no cyanosis or edema  Pulses:   2+ and symmetric all extremities  Skin:   Skin color, texture, turgor normal, no rashes or lesions  Lymph nodes:   Cervical, supraclavicular, and axillary nodes normal  Neurologic:   CNII-XII intact, normal strength, sensation and reflexes    throughout  .    Assessment:    Healthy female exam.    Plan:     Pap smear.  Depo provera today. Birth control options given.

## 2012-04-16 ENCOUNTER — Encounter: Payer: Self-pay | Admitting: Advanced Practice Midwife

## 2012-04-16 ENCOUNTER — Ambulatory Visit (INDEPENDENT_AMBULATORY_CARE_PROVIDER_SITE_OTHER): Payer: Medicaid Other | Admitting: Advanced Practice Midwife

## 2012-04-16 VITALS — BP 134/88 | HR 99 | Temp 98.0°F | Resp 99 | Ht 66.0 in | Wt 276.0 lb

## 2012-04-16 DIAGNOSIS — Z309 Encounter for contraceptive management, unspecified: Secondary | ICD-10-CM

## 2012-04-16 DIAGNOSIS — R030 Elevated blood-pressure reading, without diagnosis of hypertension: Secondary | ICD-10-CM

## 2012-04-16 MED ORDER — LEVONORGEST-ETH ESTRAD 91-DAY 0.15-0.03 &0.01 MG PO TABS: 1.0000 | ORAL_TABLET | Freq: Every day | ORAL | Status: DC

## 2012-04-16 NOTE — Patient Instructions (Signed)

## 2012-04-16 NOTE — Progress Notes (Signed)
  Subjective:    Patient ID: [name], female    DOB: 1981-04-23, 31 y.o.   MRN: [id number]  HPI This is a 31 y.o. female who presents for contraceptive management. States has had trouble losing weight since starting DepoProvera. Never had trouble losing weight before. She wants to switch off depo onto jolessa or mirena.  She is thinking she will try the Marcille Blanco (generic for [place]) first and if she has trouble taking pills, will get Mirena.   Review of Systems Denies.   Has family history of HTN Objective:   Physical Exam  Constitutional: She is oriented to person, place, and time. She appears well-developed and well-nourished.  Cardiovascular: Normal rate.   Pulmonary/Chest: Effort normal.  Neurological: She is alert and oriented to person, place, and time.  Skin: Skin is warm and dry.  Psychiatric: She has a normal mood and affect.   Filed Vitals:   04/16/12 0814  BP: 134/88  Pulse: 99  Temp: 98 F (36.7 C)  Resp: 99          Assessment & Plan:  A;  Contraceptive management       Weight gain on Depo Provera      High normal BP  P:  Discussed contraceptive methods      Rx Seasonique (generic)      BP check in 3 weeks      Discussed she needs surveillance of BP over time      RTC PRN

## 2012-04-30 ENCOUNTER — Ambulatory Visit (INDEPENDENT_AMBULATORY_CARE_PROVIDER_SITE_OTHER): Payer: Medicaid Other | Admitting: *Deleted

## 2012-04-30 VITALS — BP 133/82 | HR 83

## 2012-04-30 DIAGNOSIS — Z3041 Encounter for surveillance of contraceptive pills: Secondary | ICD-10-CM

## 2012-04-30 NOTE — Progress Notes (Signed)
Pt here for 1 month B/P check after being put on OCP.  Pt will continue to monitor B/P once a week @ work and will call if any issues.

## 2012-09-26 NOTE — L&D Delivery Note (Signed)
  Delivery Clinician:  Minta Balsam Delivery Note At 7:04 PM a viable and healthy female was delivered via Vaginal, Spontaneous Delivery (Presentation: Left Occiput Anterior).  APGAR: 8 ,9 ; weight .   Placenta status: , .  Cord: 3 vessels with the following complications: None  Anesthesia: Epidural Local  Episiotomy: Median Lacerations: 2nd degree Suture Repair: 3.0 vicryl  on ct Est. Blood Loss (mL): 300cc  Mom to postpartum.  Baby to Couplet care / Skin to Skin.  NSVD over 2nd degree tear of viable female infant. Active management of 3rd stage with pit and traction delivered an intact placenta w/ 3v cord. Repaired in usual manner with 3.0 vicryl on CT. EBL 300. Counts correct sponge, sharps.   Tawana Scale 07/26/2013, 7:55 PM  Living?:         APGARS  One minute Five minutes Ten minutes  Skin color:        Heart rate:        Grimace:        Muscle tone:        Breathing:        Totals:         Presentation/position: Vertex  Left Occiput Anterior Resuscitation:   Cord information:    Disposition of cord blood:     Blood gases sent?  Complications:   Placenta: Delivered:       appearance Newborn Measurements: Weight:   Height:   Head circumference:   Chest circumference:   Other providers: Obstetrician Delivery Nurse Waverly Ferrari  Additional  information: Forceps:   Vacuum:   Breech:   Observed anomalies

## 2012-12-05 ENCOUNTER — Encounter: Payer: Self-pay | Admitting: *Deleted

## 2012-12-05 ENCOUNTER — Ambulatory Visit (INDEPENDENT_AMBULATORY_CARE_PROVIDER_SITE_OTHER): Payer: Managed Care, Other (non HMO) | Admitting: Obstetrics & Gynecology

## 2012-12-05 ENCOUNTER — Encounter: Payer: Self-pay | Admitting: Obstetrics & Gynecology

## 2012-12-05 VITALS — BP 133/83 | HR 84 | Ht 67.0 in | Wt 281.0 lb

## 2012-12-05 DIAGNOSIS — Z01812 Encounter for preprocedural laboratory examination: Secondary | ICD-10-CM

## 2012-12-05 DIAGNOSIS — Z3201 Encounter for pregnancy test, result positive: Secondary | ICD-10-CM

## 2012-12-05 LAB — POCT URINE PREGNANCY: Preg Test, Ur: POSITIVE

## 2012-12-05 NOTE — Progress Notes (Signed)
Here today for Nexplanon insertion. Urine pregnancy test is positive.

## 2012-12-05 NOTE — Progress Notes (Signed)
  Subjective:    Patient ID: [name], female    DOB: May 19, 1981, 32 y.o.   MRN: [id number]  HPI  32 yo G1P1 with a 2 yo son is here today for Nexplanon insertion. She was on the depo provera shot last year until changing to OCPs. She missed taking several pills and had some cramping ("felt like when I was pregnant") 3 weeks ago. A UPT at her job, [employer], was negative 3 weeks ago. Upon arrival today, her UPT (twice) was positive. She is ambivilent today about the news. She requested information on abortion clinics. "I can't afford another baby now". This information, along with adoption information, was given to her. She is not sure what she will do with this pregnancy, but she will have a follow up visit here in 4 weeks is she wants to have this pregnancy.   Review of Systems     Objective:   Physical Exam        Assessment & Plan:  Unintended pregnancy- as above

## 2012-12-25 ENCOUNTER — Encounter (HOSPITAL_COMMUNITY): Payer: Self-pay | Admitting: *Deleted

## 2012-12-25 ENCOUNTER — Telehealth: Payer: Self-pay | Admitting: *Deleted

## 2012-12-25 ENCOUNTER — Inpatient Hospital Stay (HOSPITAL_COMMUNITY)
Admission: AD | Admit: 2012-12-25 | Discharge: 2012-12-25 | Disposition: A | Discharge status: Home / Self Care | Payer: Managed Care, Other (non HMO) | Source: Ambulatory Visit | Attending: Obstetrics & Gynecology | Admitting: Obstetrics & Gynecology

## 2012-12-25 DIAGNOSIS — O26891 Other specified pregnancy related conditions, first trimester: Secondary | ICD-10-CM

## 2012-12-25 DIAGNOSIS — R0789 Other chest pain: Secondary | ICD-10-CM

## 2012-12-25 DIAGNOSIS — O99891 Other specified diseases and conditions complicating pregnancy: Secondary | ICD-10-CM | POA: Insufficient documentation

## 2012-12-25 DIAGNOSIS — O219 Vomiting of pregnancy, unspecified: Secondary | ICD-10-CM

## 2012-12-25 DIAGNOSIS — O210 Mild hyperemesis gravidarum: Secondary | ICD-10-CM | POA: Insufficient documentation

## 2012-12-25 DIAGNOSIS — O26899 Other specified pregnancy related conditions, unspecified trimester: Secondary | ICD-10-CM

## 2012-12-25 DIAGNOSIS — R079 Chest pain, unspecified: Secondary | ICD-10-CM | POA: Insufficient documentation

## 2012-12-25 DIAGNOSIS — R12 Heartburn: Secondary | ICD-10-CM | POA: Insufficient documentation

## 2012-12-25 HISTORY — DX: Other specified postprocedural states: Z98.890

## 2012-12-25 HISTORY — DX: Nausea with vomiting, unspecified: R11.2

## 2012-12-25 MED ORDER — GI COCKTAIL ~~LOC~~
30.0000 mL | Freq: Once | ORAL | Status: AC
  Administered 2012-12-25: 30 mL via ORAL
  Filled 2012-12-25: qty 30

## 2012-12-25 MED ORDER — RANITIDINE HCL 150 MG PO TABS: 150.0000 mg | ORAL_TABLET | Freq: Two times a day (BID) | ORAL | Status: DC

## 2012-12-25 MED ORDER — ONDANSETRON 8 MG PO TBDP: 8.0000 mg | ORAL_TABLET | Freq: Three times a day (TID) | ORAL | Status: DC | PRN

## 2012-12-25 NOTE — Telephone Encounter (Signed)
Pt called adv she is pregnant and having chest pain 5/10 - "not sure if it is heart burn or not but very painful" - I adv pt to report to hospital for eval

## 2012-12-25 NOTE — MAU Note (Signed)
Pt reports she has been having "chest pain" off/on for the last 3 days, hurts to take a deep breath or cough. States she doesn't know if it is heartburn or now. Took pepto bismol and it helped some.

## 2012-12-25 NOTE — MAU Provider Note (Signed)
History     CSN: [id number]  Arrival date and time: 12/25/12 2023   First Provider Initiated Contact with Patient 12/25/12 2237      Chief Complaint  Patient presents with  . Heartburn   HPI [name] 32 y.o. [redacted]w[redacted]d  Comes to MAU with pain in her chest expecially when taking a deep breath or laughing.  Feels like she cannot get her breath well when walking.  Took peptobismol at 6 pm and it did help the pain but the effect is gone now.     OB History   Grav Para Term Preterm Abortions TAB SAB Ect Mult Living   2 1 1       1       Past Medical History  Diagnosis Date  . Allergy   . Anxiety   . Depression   . Obesity   . PONV (postoperative nausea and vomiting)     Past Surgical History  Procedure Laterality Date  . Cesarean section      Family History  Problem Relation Age of Onset  . Cancer Maternal Grandmother     colon  . Dementia Maternal Grandfather   . Hypothyroidism Mother   . Hypertension Mother     History  Substance Use Topics  . Smoking status: Never Smoker   . Smokeless tobacco: Never Used  . Alcohol Use: Yes     Comment: socially    Allergies: No Known Allergies  Facility-administered medications prior to admission  Medication Dose Route Frequency Provider Last Rate Last Dose  . medroxyPROGESTERone (DEPO-PROVERA) injection 150 mg  150 mg Intramuscular Q90 days August Luz, CNM   150 mg at 01/09/12 1629   Prescriptions prior to admission  Medication Sig Dispense Refill  . albuterol (PROVENTIL HFA;VENTOLIN HFA) 108 (90 BASE) MCG/ACT inhaler Inhale 2 puffs into the lungs every 6 (six) hours as needed for wheezing.      . Biotin 1000 MCG tablet Take 1,000 mcg by mouth 3 (three) times daily.      Marland Kitchen bismuth subsalicylate (PEPTO BISMOL) 262 MG chewable tablet Chew 524 mg by mouth as needed for indigestion or heartburn.      Marland Kitchen Cod Liver Oil 10 MINIM CAPS Take by mouth.      . Multiple Vitamin (MULTIVITAMIN) tablet Take 1 tablet by mouth  daily.      . Prenatal Vit-Fe Fumarate-FA (MULTIVITAMIN-PRENATAL) 27-0.8 MG TABS Take 1 tablet by mouth daily at 12 noon.        ROS Physical Exam   Blood pressure 131/76, pulse 100, temperature 98 F (36.7 C), temperature source Oral, resp. rate 20, height 5\' 7"  (1.702 m), weight 285 lb (129.275 kg), last menstrual period 09/26/2012, SpO2 100.00%.  Physical Exam  Nursing note and vitals reviewed. Constitutional: She is oriented to person, place, and time. She appears well-developed and well-nourished. No distress.  Morbidly obese  HENT:  Head: Normocephalic.  Eyes: EOM are normal.  Neck: Neck supple.  Cardiovascular: Normal rate, regular rhythm and normal heart sounds.   No murmur heard. Respiratory: Effort normal and breath sounds normal. No respiratory distress.  Pain with palpation of spaces between ribs on left chest  Musculoskeletal: Normal range of motion.  Neurological: She is alert and oriented to person, place, and time.  Skin: Skin is warm and dry.  Psychiatric: She has a normal mood and affect.    MAU Course  Procedures  MDM Client has chest wall pain with palpation.  Also seems to  have heartburn - given GI Cocktail which relieved the midline chest pain and she could breathe easier - llikely is heartburn.  Assessment and Plan  Heartburn in pregnancy Nausea and vomiting Chest wall pain  Plan rx zofran 8 mg ODT one sublingual q 12 hours (#20) no refills Drink at least 8 8-oz glasses of water every day. rx zantac 150 mg PO bid (#60) no refills Keep your appointments in the office.  BURLESON,TERRI 12/25/2012, 10:43 PM

## 2012-12-27 ENCOUNTER — Encounter: Payer: Self-pay | Admitting: Advanced Practice Midwife

## 2012-12-27 ENCOUNTER — Other Ambulatory Visit: Payer: Self-pay | Admitting: Advanced Practice Midwife

## 2012-12-27 ENCOUNTER — Other Ambulatory Visit (HOSPITAL_COMMUNITY)
Admission: RE | Admit: 2012-12-27 | Discharge: 2012-12-27 | Disposition: A | Discharge status: Home / Self Care | Payer: Managed Care, Other (non HMO) | Source: Ambulatory Visit | Attending: Obstetrics & Gynecology | Admitting: Obstetrics & Gynecology

## 2012-12-27 ENCOUNTER — Ambulatory Visit (INDEPENDENT_AMBULATORY_CARE_PROVIDER_SITE_OTHER): Payer: Managed Care, Other (non HMO) | Admitting: Advanced Practice Midwife

## 2012-12-27 VITALS — BP 148/95 | Wt 283.0 lb

## 2012-12-27 DIAGNOSIS — O10019 Pre-existing essential hypertension complicating pregnancy, unspecified trimester: Secondary | ICD-10-CM

## 2012-12-27 DIAGNOSIS — R030 Elevated blood-pressure reading, without diagnosis of hypertension: Secondary | ICD-10-CM

## 2012-12-27 DIAGNOSIS — O10919 Unspecified pre-existing hypertension complicating pregnancy, unspecified trimester: Secondary | ICD-10-CM | POA: Insufficient documentation

## 2012-12-27 DIAGNOSIS — Z124 Encounter for screening for malignant neoplasm of cervix: Secondary | ICD-10-CM

## 2012-12-27 DIAGNOSIS — Z113 Encounter for screening for infections with a predominantly sexual mode of transmission: Secondary | ICD-10-CM

## 2012-12-27 DIAGNOSIS — Z1151 Encounter for screening for human papillomavirus (HPV): Secondary | ICD-10-CM

## 2012-12-27 DIAGNOSIS — N760 Acute vaginitis: Secondary | ICD-10-CM | POA: Insufficient documentation

## 2012-12-27 DIAGNOSIS — Z3480 Encounter for supervision of other normal pregnancy, unspecified trimester: Secondary | ICD-10-CM | POA: Insufficient documentation

## 2012-12-27 DIAGNOSIS — Z3491 Encounter for supervision of normal pregnancy, unspecified, first trimester: Secondary | ICD-10-CM

## 2012-12-27 LAB — OB RESULTS CONSOLE GC/CHLAMYDIA
Chlamydia: NEGATIVE
Gonorrhea: NEGATIVE

## 2012-12-27 MED ORDER — PROMETHAZINE HCL 25 MG PO TABS: 12.5000 mg | ORAL_TABLET | Freq: Four times a day (QID) | ORAL | Status: DC | PRN

## 2012-12-27 NOTE — Addendum Note (Signed)
Addended by: Arne Cleveland on: 12/27/2012 04:21 PM   Modules accepted: Orders

## 2012-12-27 NOTE — Progress Notes (Signed)
p-88  Bedside u/s showed GA of 9 w and FHT 173  CRL 23.53mm

## 2012-12-27 NOTE — Progress Notes (Signed)
P - 95 - Unplanned pregnancy - Pt thinking about adoption but undecided at this point - Pt is unsure of LMP need scan to determine GA

## 2012-12-27 NOTE — Progress Notes (Signed)
   Subjective:    [name] is a G2P1001 @[redacted]w[redacted]d  by U/S 12/28/11 being seen today for her first obstetrical visit.  Her obstetrical history is significant for Previous C/S for nonreassuring fhr tracing and nuchal cord.  Desires VBAC this pregnancy.. Patient does intend to breast feed. Pregnancy history fully reviewed.  Patient reports fatigue, nausea and vomiting.  Pt reports exertional dyspnea prior to pregnancy, and has had >40lb weight gain in last year. No changes to her dyspnea in pregnancy at this time.    Filed Vitals:   12/27/12 1442  BP: 148/95  Weight: 283 lb (128.368 kg)    HISTORY: OB History   Grav Para Term Preterm Abortions TAB SAB Ect Mult Living   2 1 1       1      # Outc Date GA Lbr Len/2nd Wgt Sex Del Anes PTL Lv   1 TRM     M CS   Yes   Comments: System Generated. Please review and update pregnancy details.   2 CUR              Past Medical History  Diagnosis Date  . Allergy   . Anxiety   . Depression   . Obesity   . PONV (postoperative nausea and vomiting)    Past Surgical History  Procedure Laterality Date  . Cesarean section     Family History  Problem Relation Age of Onset  . Cancer Maternal Grandmother     colon  . Dementia Maternal Grandfather   . Hypothyroidism Mother   . Hypertension Mother      Exam    Uterus:     Pelvic Exam:    Perineum: No Hemorrhoids   Vulva: normal, Bartholin's, Urethra, Skene's normal   Vagina:  normal mucosa, normal discharge   pH:    Cervix: no cervical motion tenderness and no lesions   Adnexa: normal adnexa and no mass, fullness, tenderness   Bony Pelvis: average  System: Breast:  normal appearance, no masses or tenderness   Skin: normal coloration and turgor, no rashes    Neurologic: oriented, normal, normal mood, gait normal; reflexes normal and symmetric   Extremities: normal strength, tone, and muscle mass   HEENT neck supple with midline trachea and thyroid without masses   Mouth/Teeth  mucous membranes moist, pharynx normal without lesions   Neck supple and no masses   Cardiovascular: regular rate and rhythm   Respiratory:  appears well, vitals normal, no respiratory distress, acyanotic, normal RR, ear and throat exam is normal, neck free of mass or lymphadenopathy, chest clear, no wheezing, crepitations, rhonchi, normal symmetric air entry   Abdomen: soft, non-tender; bowel sounds normal; no masses,  no organomegaly   Urinary: urethral meatus normal      Assessment:    Pregnancy: G2P1001 Patient Active Problem List  Diagnosis  . Borderline blood pressure        Plan:     Initial labs drawn. Prenatal vitamins. Problem list reviewed and updated. Genetic Screening discussed First Screen: ordered.  Ultrasound discussed; fetal survey: requested.  Follow up in 4 weeks. 50% of 30 min visit spent on counseling and coordination of care.     LEFTWICH-KIRBY, Lillianna Sabel 12/27/2012

## 2012-12-27 NOTE — Patient Instructions (Addendum)
Pregnancy - First Trimester During sexual intercourse, millions of sperm go into the vagina. Only 1 sperm will penetrate and fertilize the female egg while it is in the Fallopian tube. One week later, the fertilized egg implants into the wall of the uterus. An embryo begins to develop into a baby. At 6 to 8 weeks, the eyes and face are formed and the heartbeat can be seen on ultrasound. At the end of 12 weeks (first trimester), all the baby's organs are formed. Now that you are pregnant, you will want to do everything you can to have a healthy baby. Two of the most important things are to get good prenatal care and follow your caregiver's instructions. Prenatal care is all the medical care you receive before the baby's birth. It is given to prevent, find, and treat problems during the pregnancy and childbirth. PRENATAL EXAMS  During prenatal visits, your weight, blood pressure and urine are checked. This is done to make sure you are healthy and progressing normally during the pregnancy.  A pregnant woman should gain 25 to 35 pounds during the pregnancy. However, if you are over weight or underweight, your caregiver will advise you regarding your weight.  Your caregiver will ask and answer questions for you.  Blood work, cervical cultures, other necessary tests and a Pap test are done during your prenatal exams. These tests are done to check on your health and the probable health of your baby. Tests are strongly recommended and done for HIV with your permission. This is the virus that causes AIDS. These tests are done because medications can be given to help prevent your baby from being born with this infection should you have been infected without knowing it. Blood work is also used to find out your blood type, previous infections and follow your blood levels (hemoglobin).  Low hemoglobin (anemia) is common during pregnancy. Iron and vitamins are given to help prevent this. Later in the pregnancy, blood  tests for diabetes will be done along with any other tests if any problems develop. You may need tests to make sure you and the baby are doing well.  You may need other tests to make sure you and the baby are doing well. CHANGES DURING THE FIRST TRIMESTER (THE FIRST 3 MONTHS OF PREGNANCY) Your body goes through many changes during pregnancy. They vary from person to person. Talk to your caregiver about changes you notice and are concerned about. Changes can include:  Your menstrual period stops.  The egg and sperm carry the genes that determine what you look like. Genes from you and your partner are forming a baby. The female genes determine whether the baby is a boy or a girl.  Your body increases in girth and you may feel bloated.  Feeling sick to your stomach (nauseous) and throwing up (vomiting). If the vomiting is uncontrollable, call your caregiver.  Your breasts will begin to enlarge and become tender.  Your nipples may stick out more and become darker.  The need to urinate more. Painful urination may mean you have a bladder infection.  Tiring easily.  Loss of appetite.  Cravings for certain kinds of food.  At first, you may gain or lose a couple of pounds.  You may have changes in your emotions from day to day (excited to be pregnant or concerned something may go wrong with the pregnancy and baby).  You may have more vivid and strange dreams. HOME CARE INSTRUCTIONS   It is very important   to avoid all smoking, alcohol and un-prescribed drugs during your pregnancy. These affect the formation and growth of the baby. Avoid chemicals while pregnant to ensure the delivery of a healthy infant.  Start your prenatal visits by the 12th week of pregnancy. They are usually scheduled monthly at first, then more often in the last 2 months before delivery. Keep your caregiver's appointments. Follow your caregiver's instructions regarding medication use, blood and lab tests, exercise, and  diet.  During pregnancy, you are providing food for you and your baby. Eat regular, well-balanced meals. Choose foods such as meat, fish, milk and other low fat dairy products, vegetables, fruits, and whole-grain breads and cereals. Your caregiver will tell you of the ideal weight gain.  You can help morning sickness by keeping soda crackers at the bedside. Eat a couple before arising in the morning. You may want to use the crackers without salt on them.  Eating 4 to 5 small meals rather than 3 large meals a day also may help the nausea and vomiting.  Drinking liquids between meals instead of during meals also seems to help nausea and vomiting.  A physical sexual relationship may be continued throughout pregnancy if there are no other problems. Problems may be early (premature) leaking of amniotic fluid from the membranes, vaginal bleeding, or belly (abdominal) pain.  Exercise regularly if there are no restrictions. Check with your caregiver or physical therapist if you are unsure of the safety of some of your exercises. Greater weight gain will occur in the last 2 trimesters of pregnancy. Exercising will help:  Control your weight.  Keep you in shape.  Prepare you for labor and delivery.  Help you lose your pregnancy weight after you deliver your baby.  Wear a good support or jogging bra for breast tenderness during pregnancy. This may help if worn during sleep too.  Ask when prenatal classes are available. Begin classes when they are offered.  Do not use hot tubs, steam rooms or saunas.  Wear your seat belt when driving. This protects you and your baby if you are in an accident.  Avoid raw meat, uncooked cheese, cat litter boxes and soil used by cats throughout the pregnancy. These carry germs that can cause birth defects in the baby.  The first trimester is a good time to visit your dentist for your dental health. Getting your teeth cleaned is OK. Use a softer toothbrush and brush  gently during pregnancy.  Ask for help if you have financial, counseling or nutritional needs during pregnancy. Your caregiver will be able to offer counseling for these needs as well as refer you for other special needs.  Do not take any medications or herbs unless told by your caregiver.  Inform your caregiver if there is any mental or physical domestic violence.  Make a list of emergency phone numbers of family, friends, hospital, and police and fire departments.  Write down your questions. Take them to your prenatal visit.  Do not douche.  Do not cross your legs.  If you have to stand for long periods of time, rotate you feet or take small steps in a circle.  You may have more vaginal secretions that may require a sanitary pad. Do not use tampons or scented sanitary pads. MEDICATIONS AND DRUG USE IN PREGNANCY  Take prenatal vitamins as directed. The vitamin should contain 1 milligram of folic acid. Keep all vitamins out of reach of children. Only a couple vitamins or tablets containing iron may be   fatal to a baby or young child when ingested.  Avoid use of all medications, including herbs, over-the-counter medications, not prescribed or suggested by your caregiver. Only take over-the-counter or prescription medicines for pain, discomfort, or fever as directed by your caregiver. Do not use aspirin, ibuprofen, or naproxen unless directed by your caregiver.  Let your caregiver also know about herbs you may be using.  Alcohol is related to a number of birth defects. This includes fetal alcohol syndrome. All alcohol, in any form, should be avoided completely. Smoking will cause low birth rate and premature babies.  Street or illegal drugs are very harmful to the baby. They are absolutely forbidden. A baby born to an addicted mother will be addicted at birth. The baby will go through the same withdrawal an adult does.  Let your caregiver know about any medications that you have to take  and for what reason you take them. MISCARRIAGE IS COMMON DURING PREGNANCY A miscarriage does not mean you did something wrong. It is not a reason to worry about getting pregnant again. Your caregiver will help you with questions you may have. If you have a miscarriage, you may need minor surgery. SEEK MEDICAL CARE IF:  You have any concerns or worries during your pregnancy. It is better to call with your questions if you feel they cannot wait, rather than worry about them. SEEK IMMEDIATE MEDICAL CARE IF:   An unexplained oral temperature above 102 F (38.9 C) develops, or as your caregiver suggests.  You have leaking of fluid from the vagina (birth canal). If leaking membranes are suspected, take your temperature and inform your caregiver of this when you call.  There is vaginal spotting or bleeding. Notify your caregiver of the amount and how many pads are used.  You develop a bad smelling vaginal discharge with a change in the color.  You continue to feel sick to your stomach (nauseated) and have no relief from remedies suggested. You vomit blood or coffee ground-like materials.  You lose more than 2 pounds of weight in 1 week.  You gain more than 2 pounds of weight in 1 week and you notice swelling of your face, hands, feet, or legs.  You gain 5 pounds or more in 1 week (even if you do not have swelling of your hands, face, legs, or feet).  You get exposed to German measles and have never had them.  You are exposed to fifth disease or chickenpox.  You develop belly (abdominal) pain. Round ligament discomfort is a common non-cancerous (benign) cause of abdominal pain in pregnancy. Your caregiver still must evaluate this.  You develop headache, fever, diarrhea, pain with urination, or shortness of breath.  You fall or are in a car accident or have any kind of trauma.  There is mental or physical violence in your home. Document Released: 09/06/2001 Document Revised: 12/05/2011  Document Reviewed: 03/10/2009 Exit[employer] Patient Information 2013 [employer].  

## 2012-12-28 ENCOUNTER — Encounter: Payer: Managed Care, Other (non HMO) | Admitting: Obstetrics & Gynecology

## 2012-12-29 LAB — OBSTETRIC PANEL
Basophils Absolute: 0 10*3/uL (ref 0.0–0.1)
Basophils Relative: 0 % (ref 0–1)
Hemoglobin: 12.1 g/dL (ref 12.0–15.0)
Hepatitis B Surface Ag: NEGATIVE
Lymphocytes Relative: 21 % (ref 12–46)
MCHC: 33.2 g/dL (ref 30.0–36.0)
Monocytes Relative: 6 % (ref 3–12)
Neutro Abs: 13.6 10*3/uL — ABNORMAL HIGH (ref 1.7–7.7)
Neutrophils Relative %: 73 % (ref 43–77)
RDW: 13.7 % (ref 11.5–15.5)
WBC: 18.7 10*3/uL — ABNORMAL HIGH (ref 4.0–10.5)

## 2012-12-29 LAB — CULTURE, OB URINE

## 2013-01-01 ENCOUNTER — Encounter (HOSPITAL_COMMUNITY): Payer: Self-pay | Admitting: Advanced Practice Midwife

## 2013-01-02 ENCOUNTER — Telehealth: Payer: Self-pay | Admitting: *Deleted

## 2013-01-02 NOTE — Telephone Encounter (Signed)
Error

## 2013-01-22 ENCOUNTER — Ambulatory Visit (HOSPITAL_COMMUNITY)
Admission: RE | Admit: 2013-01-22 | Discharge: 2013-01-22 | Disposition: A | Discharge status: Home / Self Care | Payer: Managed Care, Other (non HMO) | Source: Ambulatory Visit | Attending: Obstetrics & Gynecology | Admitting: Obstetrics & Gynecology

## 2013-01-22 ENCOUNTER — Ambulatory Visit (HOSPITAL_COMMUNITY)
Admission: RE | Admit: 2013-01-22 | Discharge: 2013-01-22 | Disposition: A | Discharge status: Home / Self Care | Payer: Managed Care, Other (non HMO) | Source: Ambulatory Visit | Attending: Advanced Practice Midwife | Admitting: Advanced Practice Midwife

## 2013-01-22 DIAGNOSIS — Z3689 Encounter for other specified antenatal screening: Secondary | ICD-10-CM | POA: Insufficient documentation

## 2013-01-22 DIAGNOSIS — O351XX0 Maternal care for (suspected) chromosomal abnormality in fetus, not applicable or unspecified: Secondary | ICD-10-CM | POA: Insufficient documentation

## 2013-01-22 DIAGNOSIS — E669 Obesity, unspecified: Secondary | ICD-10-CM | POA: Insufficient documentation

## 2013-01-22 DIAGNOSIS — O3510X0 Maternal care for (suspected) chromosomal abnormality in fetus, unspecified, not applicable or unspecified: Secondary | ICD-10-CM | POA: Insufficient documentation

## 2013-01-22 DIAGNOSIS — O34219 Maternal care for unspecified type scar from previous cesarean delivery: Secondary | ICD-10-CM | POA: Insufficient documentation

## 2013-01-22 DIAGNOSIS — Z3491 Encounter for supervision of normal pregnancy, unspecified, first trimester: Secondary | ICD-10-CM

## 2013-01-29 ENCOUNTER — Encounter: Payer: Self-pay | Admitting: Family

## 2013-02-01 ENCOUNTER — Ambulatory Visit (INDEPENDENT_AMBULATORY_CARE_PROVIDER_SITE_OTHER): Payer: Managed Care, Other (non HMO) | Admitting: Obstetrics and Gynecology

## 2013-02-01 ENCOUNTER — Encounter: Payer: Self-pay | Admitting: Obstetrics and Gynecology

## 2013-02-01 VITALS — BP 130/70 | Wt 284.0 lb

## 2013-02-01 DIAGNOSIS — O09299 Supervision of pregnancy with other poor reproductive or obstetric history, unspecified trimester: Secondary | ICD-10-CM | POA: Insufficient documentation

## 2013-02-01 DIAGNOSIS — O09291 Supervision of pregnancy with other poor reproductive or obstetric history, first trimester: Secondary | ICD-10-CM

## 2013-02-01 DIAGNOSIS — Z8632 Personal history of gestational diabetes: Secondary | ICD-10-CM

## 2013-02-01 DIAGNOSIS — O99210 Obesity complicating pregnancy, unspecified trimester: Secondary | ICD-10-CM

## 2013-02-01 DIAGNOSIS — E669 Obesity, unspecified: Secondary | ICD-10-CM

## 2013-02-01 DIAGNOSIS — Z9889 Other specified postprocedural states: Secondary | ICD-10-CM

## 2013-02-01 DIAGNOSIS — Z98891 History of uterine scar from previous surgery: Secondary | ICD-10-CM | POA: Insufficient documentation

## 2013-02-01 NOTE — Progress Notes (Signed)
Hx GDM. Declines early glucola today and will return next wk. Rec no simple sugars. Had integrated screen. Sched anatomy US next visit. Discussed TOLAC and consent given>sign next.

## 2013-02-01 NOTE — Patient Instructions (Signed)
Acetaminophen; Butalbital tablets or capsules What is this medicine? ACETAMINOPHEN; BUTALBITAL (a set a MEE noe fen; byoo TAL bi tal) is a pain reliever. It is used to treat tension headaches. This medicine may be used for other purposes; ask your health care provider or pharmacist if you have questions. What should I tell my health care provider before I take this medicine? They need to know if you have any of these conditions: -drink more than 3 alcohol containing drinks per day -drug abuse or addiction -kidney disease -liver disease -porphyria -an unusual or allergic reaction to acetaminophen, butalbital or other barbiturates, other medicines, foods, dyes, or preservatives -pregnant or trying to get pregnant -breast-feeding How should I use this medicine? Take this medicine by mouth with a glass of water. Follow the directions on the prescription label. You can take it with or without food. If it upsets your stomach, take it with food. Take your medicine at regular intervals. Do not take your medicine more often than directed. Talk to your pediatrician regarding the use of this medicine in children. While this drug may be prescribed for children as young as 12 years old for selected conditions, precautions do apply. Patients over 65 years old may have a stronger reaction and need a smaller dose. Overdosage: If you think you have taken too much of this medicine contact a poison control center or emergency room at once. NOTE: This medicine is only for you. Do not share this medicine with others. What if I miss a dose? If you miss a dose, take it as soon as you can. If it is almost time for your next dose, take only that dose. Do not take double or extra doses. What may interact with this medicine? Do not take this medicine with any of the following medications: -alcohol This medicine may also interact with the following medications: -antihistamines for allergy, cough and cold -any  medicine that contains acetaminophen -barbiturates like phenobarbital -female hormones, including contraceptive or birth control pills -MAOIs like Carbex, Eldepryl, Marplan, Nardil, and Parnate -medicines for depression, anxiety, or psychotic disturbances -medicines for pain -medicines for sleep -muscle relaxants -other narcotic medicines -warfarin This list may not describe all possible interactions. Give your health care provider a list of all the medicines, herbs, non-prescription drugs, or dietary supplements you use. Also tell them if you smoke, drink alcohol, or use illegal drugs. Some items may interact with your medicine. What should I watch for while using this medicine? Tell your doctor or health care professional if your pain does not go away, if it gets worse, or if you have new or a different type of pain. You may develop tolerance to the medicine. Tolerance means that you will need a higher dose of the medicine for pain relief. Tolerance is normal and is expected if you take the medicine for a long time. Do not suddenly stop taking your medicine because you may develop a severe reaction. Your body becomes used to the medicine. This does NOT mean you are addicted. Addiction is a behavior related to getting and using a drug for a non-medical reason. If you have pain, you have a medical reason to take pain medicine. Your doctor will tell you how much medicine to take. If your doctor wants you to stop the medicine, the dose will be slowly lowered over time to avoid any side effects. You may get drowsy or dizzy. Do not drive, use machinery, or do anything that needs mental alertness until you know   how this medicine affects you. Do not stand or sit up quickly, especially if you are an older patient. This reduces the risk of dizzy or fainting spells. Alcohol may interfere with the effect of this medicine. Avoid alcoholic drinks. Do not take other medicines that contain acetaminophen with this  medicine. Always read labels carefully. If you have questions, ask your doctor or pharmacist. If you take too much acetaminophen get medical help right away. Too much acetaminophen can be very dangerous and cause liver damage. Even if you do not have symptoms, it is important to get help right away. What side effects may I notice from receiving this medicine? Side effects that you should report to your doctor or health care professional as soon as possible: -allergic reactions like skin rash, itching or hives, swelling of the face, lips, or tongue -breathing problems -confusion -depression or excitement -fast, irregular heartbeat -feeling faint or lightheaded -fever -redness, blistering, peeling or loosening of the skin, including inside the mouth -seizure -unusual bleeding, bruising -unusually weak or tired Side effects that usually do not require medical attention (report to your doctor or health care professional if they continue or are bothersome): -constipation -drowsy, dizzy -dry mouth -headache -nausea, vomiting -stomach pain This list may not describe all possible side effects. Call your doctor for medical advice about side effects. You may report side effects to FDA at 1-800-FDA-1088. Where should I keep my medicine? Keep out of the reach of children. This medicine can be abused. Keep your medicine in a safe place to protect it from theft. Do not share this medicine with anyone. Selling or giving away this medicine is dangerous and against the law. Store at room temperature between 15 and 30 degrees C (59 and 86 degrees F). Keep your medicine container closed tightly. Throw away any unused medicine after the expiration date. NOTE: This sheet is a summary. It may not cover all possible information. If you have questions about this medicine, talk to your doctor, pharmacist, or health care provider.  2013, [employer]. (08/19/2009 10:43:43 AM)  

## 2013-02-01 NOTE — Progress Notes (Signed)
p=[phone] 

## 2013-02-11 ENCOUNTER — Inpatient Hospital Stay (HOSPITAL_COMMUNITY)
Admission: AD | Admit: 2013-02-11 | Discharge: 2013-02-11 | Disposition: A | Discharge status: Home / Self Care | Payer: Managed Care, Other (non HMO) | Source: Ambulatory Visit | Attending: Obstetrics & Gynecology | Admitting: Obstetrics & Gynecology

## 2013-02-11 ENCOUNTER — Encounter (HOSPITAL_COMMUNITY): Payer: Self-pay | Admitting: *Deleted

## 2013-02-11 DIAGNOSIS — B9689 Other specified bacterial agents as the cause of diseases classified elsewhere: Secondary | ICD-10-CM | POA: Insufficient documentation

## 2013-02-11 DIAGNOSIS — O2390 Unspecified genitourinary tract infection in pregnancy, unspecified trimester: Secondary | ICD-10-CM | POA: Insufficient documentation

## 2013-02-11 DIAGNOSIS — R109 Unspecified abdominal pain: Secondary | ICD-10-CM | POA: Insufficient documentation

## 2013-02-11 DIAGNOSIS — A499 Bacterial infection, unspecified: Secondary | ICD-10-CM | POA: Insufficient documentation

## 2013-02-11 DIAGNOSIS — O26899 Other specified pregnancy related conditions, unspecified trimester: Secondary | ICD-10-CM

## 2013-02-11 DIAGNOSIS — N760 Acute vaginitis: Secondary | ICD-10-CM | POA: Insufficient documentation

## 2013-02-11 LAB — URINE MICROSCOPIC-ADD ON

## 2013-02-11 LAB — URINALYSIS, ROUTINE W REFLEX MICROSCOPIC
Bilirubin Urine: NEGATIVE
Nitrite: NEGATIVE
Specific Gravity, Urine: >1.03 — ABNORMAL HIGH (ref 1.005–1.030)
pH: 6 (ref 5.0–8.0)

## 2013-02-11 LAB — WET PREP, GENITAL: Trich, Wet Prep: NONE SEEN

## 2013-02-11 MED ORDER — METRONIDAZOLE 500 MG PO TABS: 500.0000 mg | ORAL_TABLET | Freq: Two times a day (BID) | ORAL | Status: DC

## 2013-02-11 NOTE — MAU Note (Signed)
Pt states she has been having a twisting pain in lower abd since last night.  No vaginal bleediing but does have some vaginal itching.  No ROM.

## 2013-02-11 NOTE — MAU Provider Note (Signed)
Attestation of Attending Supervision of Advanced Practitioner (CNM/NP): Evaluation and management procedures were performed by the Advanced Practitioner under my supervision and collaboration. I have reviewed the Advanced Practitioner's note and chart, and I agree with the management and plan.  Demira Gwynne H. 8:00 PM   

## 2013-02-11 NOTE — MAU Provider Note (Signed)
History     CSN: [id number]  Arrival date and time: 02/11/13 1814   None     Chief Complaint  Patient presents with  . Abdominal Pain   HPI 32 y.o. G2P1001 at [redacted]w[redacted]d with "twisting" pain in pelvic area since last night. Increased with moving from sitting to standing, walking. No bleeding or discharge, but c/o some itching.   Past Medical History  Diagnosis Date  . Allergy   . Anxiety   . Depression   . Obesity   . PONV (postoperative nausea and vomiting)     Past Surgical History  Procedure Laterality Date  . Cesarean section      Family History  Problem Relation Age of Onset  . Cancer Maternal Grandmother     colon  . Dementia Maternal Grandfather   . Hypothyroidism Mother   . Hypertension Mother     History  Substance Use Topics  . Smoking status: Never Smoker   . Smokeless tobacco: Never Used  . Alcohol Use: Yes     Comment: socially    Allergies: No Known Allergies  Prescriptions prior to admission  Medication Sig Dispense Refill  . acetaminophen (TYLENOL) 500 MG tablet Take 1,000 mg by mouth every 6 (six) hours as needed for pain (For headache.).      Marland Kitchen Biotin 1000 MCG tablet Take 1,000 mcg by mouth daily.       . cetirizine (ZYRTEC) 10 MG tablet Take 10 mg by mouth daily as needed for allergies.      Marland Kitchen Cod Liver Oil 10 MINIM CAPS Take 1 capsule by mouth daily.       . ondansetron (ZOFRAN ODT) 8 MG disintegrating tablet Take 1 tablet (8 mg total) by mouth every 8 (eight) hours as needed for nausea.  20 tablet  0  . OVER THE COUNTER MEDICATION Take 2 capsules by mouth daily. Juice Plus vegetable and fruit supplement.      . Prenatal Vit-Fe Fumarate-FA (PRENATAL MULTIVITAMIN) TABS Take 1 tablet by mouth daily at 12 noon.      . ranitidine (ZANTAC) 150 MG tablet Take 150 mg by mouth 2 (two) times daily as needed for heartburn.      . [DISCONTINUED] ranitidine (ZANTAC) 150 MG tablet Take 1 tablet (150 mg total) by mouth 2 (two) times daily.  60 tablet  0   . albuterol (PROVENTIL HFA;VENTOLIN HFA) 108 (90 BASE) MCG/ACT inhaler Inhale 2 puffs into the lungs every 6 (six) hours as needed for wheezing.        Review of Systems  Constitutional: Negative.   Respiratory: Negative.   Cardiovascular: Negative.   Gastrointestinal: Positive for abdominal pain. Negative for nausea, vomiting, diarrhea and constipation.  Genitourinary: Negative for dysuria, urgency, frequency, hematuria and flank pain.       Negative for vaginal bleeding, cramping/contractions, + itching  Musculoskeletal: Negative.   Neurological: Negative.   Psychiatric/Behavioral: Negative.    Physical Exam   Blood pressure 111/61, pulse 104, temperature 97.8 F (36.6 C), temperature source Oral, height 5\' 7"  (1.702 m), weight 282 lb (127.914 kg), last menstrual period 09/26/2012, SpO2 100.00%.  Physical Exam  Nursing note and vitals reviewed. Constitutional: She is oriented to person, place, and time. She appears well-developed and well-nourished. No distress.  Obese   Cardiovascular: Normal rate.   Respiratory: Effort normal.  GI: Soft. There is no tenderness.  Genitourinary: Vaginal discharge (malodorous white) found.  Cervix closed   Musculoskeletal: Normal range of motion.  Neurological: She is  alert and oriented to person, place, and time.  Skin: Skin is warm and dry.  Psychiatric: She has a normal mood and affect.    MAU Course  Procedures + FHR  Results for orders placed during the hospital encounter of 02/11/13 (from the past 24 hour(s))  URINALYSIS, ROUTINE W REFLEX MICROSCOPIC     Status: Abnormal   Collection Time    02/11/13  6:25 PM      Result Value Range   Color, Urine YELLOW  YELLOW   APPearance HAZY (*) CLEAR   Specific Gravity, Urine >1.030 (*) 1.005 - 1.030   pH 6.0  5.0 - 8.0   Glucose, UA NEGATIVE  NEGATIVE mg/dL   Hgb urine dipstick TRACE (*) NEGATIVE   Bilirubin Urine NEGATIVE  NEGATIVE   Ketones, ur 15 (*) NEGATIVE mg/dL   Protein, ur  NEGATIVE  NEGATIVE mg/dL   Urobilinogen, UA 0.2  0.0 - 1.0 mg/dL   Nitrite NEGATIVE  NEGATIVE   Leukocytes, UA NEGATIVE  NEGATIVE  URINE MICROSCOPIC-ADD ON     Status: Abnormal   Collection Time    02/11/13  6:25 PM      Result Value Range   Squamous Epithelial / LPF MANY (*) RARE   WBC, UA 0-2  <3 WBC/hpf   Crystals CA OXALATE CRYSTALS (*) NEGATIVE  WET PREP, GENITAL     Status: Abnormal   Collection Time    02/11/13  7:18 PM      Result Value Range   Yeast Wet Prep HPF POC NONE SEEN  NONE SEEN   Trich, Wet Prep NONE SEEN  NONE SEEN   Clue Cells Wet Prep HPF POC FEW (*) NONE SEEN   WBC, Wet Prep HPF POC FEW (*) NONE SEEN     Assessment and Plan   1. BV (bacterial vaginosis)   2. Pain of round ligament complicating pregnancy, antepartum   Rx flagyl Rev'd precautions Encouraged better hydration    Medication List    TAKE these medications       acetaminophen 500 MG tablet  Commonly known as:  TYLENOL  Take 1,000 mg by mouth every 6 (six) hours as needed for pain (For headache.).     albuterol 108 (90 BASE) MCG/ACT inhaler  Commonly known as:  PROVENTIL HFA;VENTOLIN HFA  Inhale 2 puffs into the lungs every 6 (six) hours as needed for wheezing.     Biotin 1000 MCG tablet  Take 1,000 mcg by mouth daily.     cetirizine 10 MG tablet  Commonly known as:  ZYRTEC  Take 10 mg by mouth daily as needed for allergies.     Cod Liver Oil 10 MINIM Caps  Take 1 capsule by mouth daily.     metroNIDAZOLE 500 MG tablet  Commonly known as:  FLAGYL  Take 1 tablet (500 mg total) by mouth 2 (two) times daily.     ondansetron 8 MG disintegrating tablet  Commonly known as:  ZOFRAN ODT  Take 1 tablet (8 mg total) by mouth every 8 (eight) hours as needed for nausea.     OVER THE COUNTER MEDICATION  Take 2 capsules by mouth daily. Juice Plus vegetable and fruit supplement.     prenatal multivitamin Tabs  Take 1 tablet by mouth daily at 12 noon.     ranitidine 150 MG tablet   Commonly known as:  ZANTAC  Take 150 mg by mouth 2 (two) times daily as needed for heartburn.  Follow-up Information   Follow up with [facility] [place]. (as scheduled)    Contact information:   [address] [place] [address] [phone]        [name] 02/11/2013, 7:13 PM

## 2013-02-21 ENCOUNTER — Ambulatory Visit (INDEPENDENT_AMBULATORY_CARE_PROVIDER_SITE_OTHER): Payer: Managed Care, Other (non HMO) | Admitting: Obstetrics & Gynecology

## 2013-02-21 ENCOUNTER — Encounter: Payer: Self-pay | Admitting: *Deleted

## 2013-02-21 ENCOUNTER — Encounter: Payer: Self-pay | Admitting: Obstetrics & Gynecology

## 2013-02-21 VITALS — BP 143/79 | Wt 289.0 lb

## 2013-02-21 DIAGNOSIS — Z3492 Encounter for supervision of normal pregnancy, unspecified, second trimester: Secondary | ICD-10-CM

## 2013-02-21 DIAGNOSIS — Z3480 Encounter for supervision of other normal pregnancy, unspecified trimester: Secondary | ICD-10-CM

## 2013-02-21 NOTE — Progress Notes (Signed)
P 

## 2013-02-21 NOTE — Progress Notes (Signed)
Routine OB visit. She is getting an early 1 hour glucola. I am ordering a 24 hour urine and MSAFP today. I am scheduling an anatomy u/s. Her biggest complaint today is the abd pain caused by her ventral hernia. I have reassured her that this does not affect her pregnancy or her TOLAC. I have recommended an abdominal binder now and a gen surgery consult when she is post partum.

## 2013-02-21 NOTE — Progress Notes (Signed)
p-89   Hernia noted @ [facility]

## 2013-02-22 ENCOUNTER — Telehealth: Payer: Self-pay | Admitting: *Deleted

## 2013-02-22 LAB — GLUCOSE TOLERANCE, 1 HOUR (50G) W/O FASTING: Glucose, 1 Hour GTT: 157 mg/dL — ABNORMAL HIGH (ref 70–140)

## 2013-02-22 NOTE — Telephone Encounter (Signed)
Pt notified that she does need to do a 3 hr GTT due to failing her 1 hr.  She is scheduled for 02/28/13

## 2013-02-22 NOTE — Telephone Encounter (Signed)
Message copied by Granville Lewis on Fri Feb 22, 2013  9:19 AM ------      Message from: Allie Bossier      Created: Fri Feb 22, 2013  8:31 AM       Her 1 hour glucola was 157. She needs a 3 hour GTT asap.      Thanks.  You guys can spend more quality time together.       ------

## 2013-02-26 ENCOUNTER — Encounter: Payer: Self-pay | Admitting: Obstetrics & Gynecology

## 2013-02-28 ENCOUNTER — Encounter: Payer: Managed Care, Other (non HMO) | Admitting: Obstetrics & Gynecology

## 2013-02-28 ENCOUNTER — Other Ambulatory Visit: Payer: Managed Care, Other (non HMO)

## 2013-02-28 LAB — GLUCOSE TOLERANCE, 3 HOURS: Glucose Tolerance, 1 hour: 204 mg/dL — ABNORMAL HIGH (ref 70–189)

## 2013-03-01 ENCOUNTER — Telehealth: Payer: Self-pay | Admitting: *Deleted

## 2013-03-01 LAB — PROTEIN, URINE, 24 HOUR: Protein, Urine: 8 mg/dL

## 2013-03-01 NOTE — Telephone Encounter (Signed)
Pt notified of abnormal 3 GTT and information sent to [facility] [place].

## 2013-03-05 ENCOUNTER — Telehealth: Payer: Self-pay | Admitting: *Deleted

## 2013-03-05 ENCOUNTER — Encounter: Payer: Self-pay | Admitting: *Deleted

## 2013-03-05 DIAGNOSIS — O24419 Gestational diabetes mellitus in pregnancy, unspecified control: Secondary | ICD-10-CM

## 2013-03-05 NOTE — Telephone Encounter (Signed)
[facility] for Comprehensive diabetes monitoring and teaching

## 2013-03-06 ENCOUNTER — Encounter: Payer: Self-pay | Admitting: Obstetrics & Gynecology

## 2013-03-06 ENCOUNTER — Telehealth: Payer: Self-pay | Admitting: *Deleted

## 2013-03-06 ENCOUNTER — Encounter: Payer: Managed Care, Other (non HMO) | Attending: Obstetrics & Gynecology | Admitting: *Deleted

## 2013-03-06 ENCOUNTER — Encounter: Payer: Self-pay | Admitting: *Deleted

## 2013-03-06 VITALS — Ht 66.5 in | Wt 285.5 lb

## 2013-03-06 DIAGNOSIS — Z713 Dietary counseling and surveillance: Secondary | ICD-10-CM | POA: Insufficient documentation

## 2013-03-06 DIAGNOSIS — O24419 Gestational diabetes mellitus in pregnancy, unspecified control: Secondary | ICD-10-CM | POA: Insufficient documentation

## 2013-03-06 DIAGNOSIS — O99810 Abnormal glucose complicating pregnancy: Secondary | ICD-10-CM | POA: Insufficient documentation

## 2013-03-06 HISTORY — DX: Gestational diabetes mellitus in pregnancy, unspecified control: O24.419

## 2013-03-06 NOTE — Telephone Encounter (Signed)
Pt is scheduled for her Diabetes class today in [place].

## 2013-03-06 NOTE — Progress Notes (Signed)
  Patient was seen on 03/06/2013 for Gestational Diabetes self-management class at [facility]. The following learning objectives were met by the patient during this course:   States the definition of Gestational Diabetes  States why dietary management is important in controlling blood glucose  Describes the effects each nutrient has on blood glucose levels  Demonstrates ability to create a balanced meal plan  Demonstrates carbohydrate counting   States when to check blood glucose levels  Demonstrates proper blood glucose monitoring techniques  States the effect of stress and exercise on blood glucose levels  States the importance of limiting caffeine and abstaining from alcohol and smoking  Blood glucose monitor given: Accu Chek Nano BG Monitoring Kit Lot # [id number] Exp: 04/25/2014 Blood glucose reading: 113 mg/dl  Patient instructed to monitor glucose levels: FBS: 60 - <90 2 hour: <120  *Patient received handouts:  Nutrition Diabetes and Pregnancy  Carbohydrate Counting List  Patient will be seen for follow-up as needed.

## 2013-03-06 NOTE — Telephone Encounter (Signed)
Message copied by Granville Lewis on Wed Mar 06, 2013 11:50 AM ------      Message from: Nicholaus Bloom C      Created: Wed Mar 06, 2013 11:33 AM       [name]. She has GDM. Please set her up for nutrition counseling and have her come here soon to discuss treatment.      Thanks ------

## 2013-03-06 NOTE — Patient Instructions (Signed)
Goals:  Check glucose levels per MD as instructed  Follow Gestational Diabetes Diet as instructed  Call for follow-up as needed    

## 2013-03-07 ENCOUNTER — Ambulatory Visit (HOSPITAL_COMMUNITY)
Admission: RE | Admit: 2013-03-07 | Discharge: 2013-03-07 | Disposition: A | Discharge status: Home / Self Care | Payer: Managed Care, Other (non HMO) | Source: Ambulatory Visit | Attending: Obstetrics & Gynecology | Admitting: Obstetrics & Gynecology

## 2013-03-07 ENCOUNTER — Telehealth: Payer: Self-pay | Admitting: *Deleted

## 2013-03-07 DIAGNOSIS — O34219 Maternal care for unspecified type scar from previous cesarean delivery: Secondary | ICD-10-CM | POA: Insufficient documentation

## 2013-03-07 DIAGNOSIS — O99810 Abnormal glucose complicating pregnancy: Secondary | ICD-10-CM | POA: Insufficient documentation

## 2013-03-07 DIAGNOSIS — E669 Obesity, unspecified: Secondary | ICD-10-CM | POA: Insufficient documentation

## 2013-03-07 DIAGNOSIS — Z363 Encounter for antenatal screening for malformations: Secondary | ICD-10-CM | POA: Insufficient documentation

## 2013-03-07 DIAGNOSIS — O24419 Gestational diabetes mellitus in pregnancy, unspecified control: Secondary | ICD-10-CM

## 2013-03-07 DIAGNOSIS — O10019 Pre-existing essential hypertension complicating pregnancy, unspecified trimester: Secondary | ICD-10-CM | POA: Insufficient documentation

## 2013-03-07 DIAGNOSIS — O358XX0 Maternal care for other (suspected) fetal abnormality and damage, not applicable or unspecified: Secondary | ICD-10-CM | POA: Insufficient documentation

## 2013-03-07 DIAGNOSIS — Z1389 Encounter for screening for other disorder: Secondary | ICD-10-CM | POA: Insufficient documentation

## 2013-03-07 DIAGNOSIS — Z3492 Encounter for supervision of normal pregnancy, unspecified, second trimester: Secondary | ICD-10-CM

## 2013-03-07 MED ORDER — GLUCOSE BLOOD VI STRP: ORAL_STRIP | Status: DC

## 2013-03-07 MED ORDER — ACCU-CHEK FASTCLIX LANCETS MISC: 1.0000 | Freq: Four times a day (QID) | Status: DC

## 2013-03-07 NOTE — Telephone Encounter (Signed)
RX for lancets and glucometer strips sent to [facility] on [address]

## 2013-03-21 ENCOUNTER — Encounter (HOSPITAL_COMMUNITY): Payer: Self-pay | Admitting: *Deleted

## 2013-03-21 ENCOUNTER — Inpatient Hospital Stay (HOSPITAL_COMMUNITY)
Admission: AD | Admit: 2013-03-21 | Discharge: 2013-03-21 | Disposition: A | Discharge status: Home / Self Care | Payer: Managed Care, Other (non HMO) | Source: Ambulatory Visit | Attending: Obstetrics & Gynecology | Admitting: Obstetrics & Gynecology

## 2013-03-21 DIAGNOSIS — O10019 Pre-existing essential hypertension complicating pregnancy, unspecified trimester: Secondary | ICD-10-CM | POA: Insufficient documentation

## 2013-03-21 DIAGNOSIS — D72829 Elevated white blood cell count, unspecified: Secondary | ICD-10-CM | POA: Insufficient documentation

## 2013-03-21 DIAGNOSIS — O26899 Other specified pregnancy related conditions, unspecified trimester: Secondary | ICD-10-CM

## 2013-03-21 DIAGNOSIS — O99810 Abnormal glucose complicating pregnancy: Secondary | ICD-10-CM | POA: Insufficient documentation

## 2013-03-21 DIAGNOSIS — R109 Unspecified abdominal pain: Secondary | ICD-10-CM | POA: Insufficient documentation

## 2013-03-21 HISTORY — DX: Gestational diabetes mellitus in pregnancy, unspecified control: O24.419

## 2013-03-21 LAB — CBC
Hemoglobin: 11.3 g/dL — ABNORMAL LOW (ref 12.0–15.0)
MCH: 30.1 pg (ref 26.0–34.0)
MCV: 90.1 fL (ref 78.0–100.0)
RBC: 3.75 MIL/uL — ABNORMAL LOW (ref 3.87–5.11)

## 2013-03-21 MED ORDER — ACETAMINOPHEN 500 MG PO TABS: 1000.0000 mg | ORAL_TABLET | Freq: Four times a day (QID) | ORAL | Status: DC | PRN

## 2013-03-21 MED ORDER — OXYCODONE-ACETAMINOPHEN 5-325 MG PO TABS
1.0000 | ORAL_TABLET | Freq: Once | ORAL | Status: AC
  Administered 2013-03-21: 1 via ORAL
  Filled 2013-03-21: qty 1

## 2013-03-21 MED ORDER — OXYCODONE-ACETAMINOPHEN 5-325 MG PO TABS: 1.0000 | ORAL_TABLET | Freq: Four times a day (QID) | ORAL | Status: DC | PRN
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
## 2013-03-21 NOTE — MAU Note (Signed)
Pt states she has been having RLP since 13-14 weeks, states abd pain became different yesterday, is now mid abd around umbilicus & to her R side.  Hx of umbilical hernia.  Pt denies bleeding or LOF.
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
## 2013-03-21 NOTE — MAU Provider Note (Signed)
History     CSN: [id number]  Arrival date and time: 03/21/13 1013   First Provider Initiated Contact with Patient 03/21/13 1114      Chief Complaint  Patient presents with  . Abdominal Pain   HPI [name] is a 32 y.o. Snyder at [redacted]w[redacted]d with CHTN and GDM who presents with severe abdominal pain.  Patient reports pain began yesterday (1pm) and has continued to persist.  Pain described as sharp and located at the umbilicus and extending to the right laterally.  No associated fevers, chills, N/V/D.  Pain is worse with movement.  No other complaints currently.  No loss of fluid, vaginal bleeding, or contractions.  *Of note, patient has a ventral hernia.  Past Medical History  Diagnosis Date  . Allergy   . Anxiety   . Depression   . Obesity   . PONV (postoperative nausea and vomiting)   . Diabetes mellitus without complication   . Gestational diabetes     Past Surgical History  Procedure Laterality Date  . Cesarean section      Family History  Problem Relation Age of Onset  . Cancer Maternal Grandmother     colon  . Dementia Maternal Grandfather   . Hypothyroidism Mother   . Hypertension Mother     History  Substance Use Topics  . Smoking status: Never Smoker   . Smokeless tobacco: Never Used  . Alcohol Use: Yes     Comment: socially    Allergies: No Known Allergies  Prescriptions prior to admission  Medication Sig Dispense Refill  . acetaminophen (TYLENOL) 500 MG tablet Take 1,000 mg by mouth every 6 (six) hours as needed for pain (For headache.).      Marland Kitchen albuterol (PROVENTIL HFA;VENTOLIN HFA) 108 (90 BASE) MCG/ACT inhaler Inhale 2 puffs into the lungs every 6 (six) hours as needed for wheezing.      . Biotin 1000 MCG tablet Take 1,000 mcg by mouth daily.       . cetirizine (ZYRTEC) 10 MG tablet Take 10 mg by mouth daily as needed for allergies.      Marland Kitchen Cod Liver Oil 10 MINIM CAPS Take 1 capsule by mouth daily.       Marland Kitchen OVER THE COUNTER MEDICATION Take 2  capsules by mouth daily. Juice Plus vegetable and fruit supplement.      . Prenatal Vit-Fe Fumarate-FA (PRENATAL MULTIVITAMIN) TABS Take 1 tablet by mouth daily at 12 noon.      . promethazine (PHENERGAN) 25 MG tablet Take 25 mg by mouth every 6 (six) hours as needed for nausea.      Marland Kitchen ACCU-CHEK FASTCLIX LANCETS MISC 1 Device by Percutaneous route 4 (four) times daily.  100 each  12  . glucose blood test strip Use as instructed  100 each  12    ROS Per HPI Physical Exam   Blood pressure 124/63, pulse 92, temperature 97.5 F (36.4 C), temperature source Oral, resp. rate 18, height 5\' 7"  (1.702 m), weight 128.731 kg (283 lb 12.8 oz), last menstrual period 09/26/2012.  Physical Exam Gen: well appearing, NAD. Heart: RRR.  Lungs: CTAB. Abd: soft, tender to palpation in the periumbilical region. No rebound or guarding. No appreciable hernia (difficult due to body habitus) Ext: no appreciable lower extremity edema bilaterally Neuro: no focal deficits.  Cervical exam: Closed/thick FHR (doppler) 156    MAU Course  Procedures  Results for orders placed during the hospital encounter of 03/21/13 (from the past 48 hour(s))  CBC     Status: Abnormal   Collection Time    03/21/13 11:52 AM      Result Value Range   WBC 15.9 (*) 4.0 - 10.5 K/uL   RBC 3.75 (*) 3.87 - 5.11 MIL/uL   Hemoglobin 11.3 (*) 12.0 - 15.0 g/dL   HCT 16.1 (*) 09.6 - 04.5 %   MCV 90.1  78.0 - 100.0 fL   MCH 30.1  26.0 - 34.0 pg   MCHC 33.4  30.0 - 36.0 g/dL   RDW 40.9  81.1 - 91.4 %   Platelets 244  150 - 400 K/uL     Assessment and Plan  [name] at [redacted]w[redacted]d with [facility] and GDM who presents with severe abdominal pain. - Abdominal pain currently controlled.  Will treat with Percocet. - Given exam, vital signs and abdominal exam incarcerated hernia unlikely.  CBC revealed slightly elevated WBC count of 15.0.  - Case discussed with CNM Georges Mouse.  Will discharge home with instructions to get abdominal  binder (per Dr. Ellin Saba recommendations during prior office visit); also sending her home with Percocet (#20).  Return instructions discussed.  [name] Other 03/21/2013, 11:14 AM   I saw and examined patient along with student and agree with above note.   [name] 03/22/2013 6:12 PM

## 2013-03-22 ENCOUNTER — Ambulatory Visit (INDEPENDENT_AMBULATORY_CARE_PROVIDER_SITE_OTHER): Payer: Managed Care, Other (non HMO) | Admitting: Family

## 2013-03-22 VITALS — BP 129/80 | Wt 284.0 lb

## 2013-03-22 DIAGNOSIS — O24419 Gestational diabetes mellitus in pregnancy, unspecified control: Secondary | ICD-10-CM

## 2013-03-22 DIAGNOSIS — Z3482 Encounter for supervision of other normal pregnancy, second trimester: Secondary | ICD-10-CM

## 2013-03-22 DIAGNOSIS — Z3480 Encounter for supervision of other normal pregnancy, unspecified trimester: Secondary | ICD-10-CM

## 2013-03-22 DIAGNOSIS — O99810 Abnormal glucose complicating pregnancy: Secondary | ICD-10-CM

## 2013-03-22 NOTE — Progress Notes (Signed)
F91-101 (7/7 abnml), B 80-129 (1/15 abnml); L 84-133 (3/12 abnml); D 93-109; Reviewed numbers in meter, not written in log; advised pt to write in log, easier to read and assess; Refer to Nutrition at [facility] (6/30/ to discuss diet/monitoring; reassess in two weeks, if still elevated in AM begin glyburide 2.5 mg HS; follow-up ultrasound at [facility] for anatomy.

## 2013-03-25 ENCOUNTER — Ambulatory Visit (HOSPITAL_COMMUNITY)
Admission: RE | Admit: 2013-03-25 | Discharge: 2013-03-25 | Disposition: A | Discharge status: Home / Self Care | Payer: Managed Care, Other (non HMO) | Source: Ambulatory Visit | Attending: Family | Admitting: Family

## 2013-03-25 ENCOUNTER — Encounter: Payer: Managed Care, Other (non HMO) | Admitting: *Deleted

## 2013-03-25 ENCOUNTER — Other Ambulatory Visit: Payer: Managed Care, Other (non HMO)

## 2013-03-25 DIAGNOSIS — O34219 Maternal care for unspecified type scar from previous cesarean delivery: Secondary | ICD-10-CM | POA: Insufficient documentation

## 2013-03-25 DIAGNOSIS — O24419 Gestational diabetes mellitus in pregnancy, unspecified control: Secondary | ICD-10-CM

## 2013-03-25 DIAGNOSIS — O10019 Pre-existing essential hypertension complicating pregnancy, unspecified trimester: Secondary | ICD-10-CM | POA: Insufficient documentation

## 2013-03-25 DIAGNOSIS — O99210 Obesity complicating pregnancy, unspecified trimester: Secondary | ICD-10-CM | POA: Insufficient documentation

## 2013-03-25 DIAGNOSIS — O99810 Abnormal glucose complicating pregnancy: Secondary | ICD-10-CM | POA: Insufficient documentation

## 2013-03-25 DIAGNOSIS — E669 Obesity, unspecified: Secondary | ICD-10-CM | POA: Insufficient documentation

## 2013-03-25 NOTE — Progress Notes (Unsigned)
Nutrition note: GDM education Pt has h/o obesity & has GDM. Pt has lost 2.6# @ 21w 4d. Pt reports eating 2-3 meals/ d and the occasional snacks and drinks water daily and milk, sweet tea, & lemonade occ. Pt reports not exercising often. BS range from 81-136 and reports that her BS are usually high in the morning before breakfast. Pt received verbal & written education on GDM diet. Encouraged adding a protein with all meals & snacks. Disc wt gain goals of 11-20# or 0.5#/wk. Pt agrees to follow GDM diet with 3 meals & 2-3 snacks with proper CHO/ protein combination. Pt has WIC & plans to BF. F/u in 2-4 wks Blondell Reveal, MS, RD, LDN

## 2013-03-25 NOTE — Progress Notes (Unsigned)
Diabetes Coordinator/Educator on March 25, 2013 Reviewed glucose diary. Glucose levels are at goal range 2 hr post-prandial, however, fasting glucose ranges from 92-102 mg/dL.   Emphasized need to eat frequent small meals and snacks. Pt states she sometimes skips meals when she comes home from work and goes to bed.  She states she will then eat a large meal at the next meal time because she is so hungry. Although pt has attended the OP GDM class at [facility] she was not clear re necessity of eating 3 meals a day with snacks in between.  Reviewed number of servings/carbohydrate grams for meals and snacks according to what [facility] will allow her to buys at the grocery store.  Pt did not seem to understand the absolute necessity of eating on regularly scheduled meals and snacks. Reviewed pathophysiology of GDM and the insulin resistance primarily in the early morning hours (avoiding juice and fruit before 10 am.). Explained how baby utilizes the high levels of glucose that baby receives in utero.  I would recommend that if fasting glucose continues to be between 90 and 105 mg/dL that she start Glyburide 2.5 mg; if higher fasting levels next week, would recommend either increase in glyburide or using NPH 10 units at HS (best option to reserve her beta cells for making insulin the rest of the day). Pt voiced understanding of directions.  Gave her more carbohydrate counting food information. To see pt again next week. Thank you, Lenor Coffin, RN, Diabetes CNS [phone])

## 2013-03-28 ENCOUNTER — Encounter: Payer: Self-pay | Admitting: Family

## 2013-04-01 ENCOUNTER — Ambulatory Visit (INDEPENDENT_AMBULATORY_CARE_PROVIDER_SITE_OTHER): Payer: Managed Care, Other (non HMO) | Admitting: Advanced Practice Midwife

## 2013-04-01 VITALS — BP 119/77 | Wt 282.0 lb

## 2013-04-01 DIAGNOSIS — O99810 Abnormal glucose complicating pregnancy: Secondary | ICD-10-CM

## 2013-04-01 DIAGNOSIS — Z3480 Encounter for supervision of other normal pregnancy, unspecified trimester: Secondary | ICD-10-CM

## 2013-04-01 DIAGNOSIS — O10919 Unspecified pre-existing hypertension complicating pregnancy, unspecified trimester: Secondary | ICD-10-CM

## 2013-04-01 DIAGNOSIS — O10019 Pre-existing essential hypertension complicating pregnancy, unspecified trimester: Secondary | ICD-10-CM

## 2013-04-01 DIAGNOSIS — O24419 Gestational diabetes mellitus in pregnancy, unspecified control: Secondary | ICD-10-CM

## 2013-04-01 MED ORDER — GLYBURIDE 2.5 MG PO TABS: 2.5000 mg | ORAL_TABLET | Freq: Every day | ORAL | Status: DC

## 2013-04-01 NOTE — Progress Notes (Signed)
P - 108 

## 2013-04-01 NOTE — Progress Notes (Signed)
Well, some pain intermittently with ventral hernia. All FBS elevated in 90s - 110s, 2 hour PP normal. Pt is working hard to control her carbs. Rx Glyburide 2.5 mg q HS, high protein bedtime snack. Return in 2 weeks to review BS log.

## 2013-04-01 NOTE — Patient Instructions (Signed)
Pregnancy - Second Trimester The second trimester is the period between 13 to 27 weeks of your pregnancy. It is important to follow your doctor's instructions. HOME CARE   Do not smoke.  Do not drink alcohol or use drugs.  Only take medicine as told by your doctor.  Take prenatal vitamins as told. The vitamin should contain 1 milligram of folic acid.  Exercise.  Eat healthy foods. Eat regular, well-balanced meals.  You can have sex (intercourse) if there are no other problems with the pregnancy.  Do not use hot tubs, steam rooms, or saunas.  Wear a seat belt while driving.  Avoid raw meat, uncooked cheese, and litter boxes and soil used by cats.  Visit your dentist. Cleanings are okay. GET HELP RIGHT AWAY IF:   You have a temperature by mouth above 102 F (38.9 C), not controlled by medicine.  Fluid is coming from your vagina.  Blood is coming from your vagina. Light spotting is common, especially after sex (intercourse).  You have a bad smelling fluid (discharge) coming from the vagina. The fluid changes from clear to white.  You still feel sick to your stomach (nauseous).  You throw up (vomit) blood.  You lose or gain more than 2 pounds (0.9 kilograms) of weight in a week, or as suggested by your doctor.  Your face, hands, feet, or legs get puffy (swell).  You get exposed to German measles and have never had them.  You get exposed to fifth disease or chickenpox.  You have belly (abdominal) pain.  You have a bad headache that will not go away.  You have watery poop (diarrhea), pain when you pee (urinate), or have shortness of breath.  You start to have problems seeing (blurry or double vision).  You fall, are in a car accident, or have any kind of trauma.  There is mental or physical violence at home.  You have any concerns or worries during your pregnancy. MAKE SURE YOU:   Understand these instructions.  Will watch your condition.  Will get help  right away if you are not doing well or get worse. Document Released: 12/07/2009 Document Revised: 12/05/2011 Document Reviewed: 12/07/2009 [employer] Patient Information 2014 [employer].  

## 2013-04-16 ENCOUNTER — Encounter: Payer: Self-pay | Admitting: Obstetrics & Gynecology

## 2013-04-16 ENCOUNTER — Ambulatory Visit (INDEPENDENT_AMBULATORY_CARE_PROVIDER_SITE_OTHER): Payer: Managed Care, Other (non HMO) | Admitting: Obstetrics & Gynecology

## 2013-04-16 VITALS — BP 90/61 | Wt 284.0 lb

## 2013-04-16 DIAGNOSIS — Z3480 Encounter for supervision of other normal pregnancy, unspecified trimester: Secondary | ICD-10-CM

## 2013-04-16 DIAGNOSIS — Z3482 Encounter for supervision of other normal pregnancy, second trimester: Secondary | ICD-10-CM

## 2013-04-16 DIAGNOSIS — K469 Unspecified abdominal hernia without obstruction or gangrene: Secondary | ICD-10-CM

## 2013-04-16 DIAGNOSIS — O99810 Abnormal glucose complicating pregnancy: Secondary | ICD-10-CM

## 2013-04-16 DIAGNOSIS — O24419 Gestational diabetes mellitus in pregnancy, unspecified control: Secondary | ICD-10-CM

## 2013-04-16 MED ORDER — GLYBURIDE 2.5 MG PO TABS: 3.7500 mg | ORAL_TABLET | Freq: Every day | ORAL | Status: DC

## 2013-04-16 NOTE — Progress Notes (Signed)
p-96  Pt does have umbilical hernia

## 2013-04-16 NOTE — Progress Notes (Signed)
Pt forgot CBG log.  Fastings are above 90-110.  Increase glyburide to 1 1/2 pills qhs (3.75 mg).  Referral to general surgery for evaluation of possible hernia. Pt thinking of having rpt c/s and hernia repair at the same time. Growth [place] at 28 weeks.

## 2013-04-18 ENCOUNTER — Encounter: Payer: Self-pay | Admitting: *Deleted

## 2013-04-25 ENCOUNTER — Encounter: Payer: Managed Care, Other (non HMO) | Admitting: Obstetrics & Gynecology

## 2013-04-26 ENCOUNTER — Ambulatory Visit (INDEPENDENT_AMBULATORY_CARE_PROVIDER_SITE_OTHER): Payer: Managed Care, Other (non HMO) | Admitting: Obstetrics and Gynecology

## 2013-04-26 ENCOUNTER — Encounter: Payer: Self-pay | Admitting: Obstetrics and Gynecology

## 2013-04-26 VITALS — BP 122/79 | Wt 284.0 lb

## 2013-04-26 DIAGNOSIS — O24419 Gestational diabetes mellitus in pregnancy, unspecified control: Secondary | ICD-10-CM

## 2013-04-26 DIAGNOSIS — Z3480 Encounter for supervision of other normal pregnancy, unspecified trimester: Secondary | ICD-10-CM

## 2013-04-26 DIAGNOSIS — O99810 Abnormal glucose complicating pregnancy: Secondary | ICD-10-CM

## 2013-04-26 NOTE — Progress Notes (Signed)
p-100 

## 2013-04-26 NOTE — Patient Instructions (Signed)
preg thi Pregnancy - Third Trimester The third trimester of pregnancy (the last 3 months) is a period of the most rapid growth for you and your baby. The baby approaches a length of 20 inches and a weight of 6 to 10 pounds. The baby is adding on fat and getting ready for life outside your body. While inside, babies have periods of sleeping and waking, sucking thumbs, and hiccuping. You can often feel small contractions of the uterus. This is false labor. It is also called Braxton-Hicks contractions. This is like a practice for labor. The usual problems in this stage of pregnancy include more difficulty breathing, swelling of the hands and feet from water retention, and having to urinate more often because of the uterus and baby pressing on your bladder.  PRENATAL EXAMS  Blood work may continue to be done during prenatal exams. These tests are done to check on your health and the probable health of your baby. Blood work is used to follow your blood levels (hemoglobin). Anemia (low hemoglobin) is common during pregnancy. Iron and vitamins are given to help prevent this. You may also continue to be checked for diabetes. Some of the past blood tests may be done again.  The size of the uterus is measured during each visit. This makes sure your baby is growing properly according to your pregnancy dates.  Your blood pressure is checked every prenatal visit. This is to make sure you are not getting toxemia.  Your urine is checked every prenatal visit for infection, diabetes, and protein.  Your weight is checked at each visit. This is done to make sure gains are happening at the suggested rate and that you and your baby are growing normally.  Sometimes, an ultrasound is performed to confirm the position and the proper growth and development of the baby. This is a test done that bounces harmless sound waves off the baby so your caregiver can more accurately determine a due date.  Discuss the type of pain  medicine and anesthesia you will have during your labor and delivery.  Discuss the possibility and anesthesia if a cesarean section might be necessary.  Inform your caregiver if there is any mental or physical violence at home. Sometimes, a specialized non-stress test, contraction stress test, and biophysical profile are done to make sure the baby is not having a problem. Checking the amniotic fluid surrounding the baby is called an amniocentesis. The amniotic fluid is removed by sticking a needle into the belly (abdomen). This is sometimes done near the end of pregnancy if an early delivery is required. In this case, it is done to help make sure the baby's lungs are mature enough for the baby to live outside of the womb. If the lungs are not mature and it is unsafe to deliver the baby, an injection of cortisone medicine is given to the mother 1 to 2 days before the delivery. This helps the baby's lungs mature and makes it safer to deliver the baby. CHANGES OCCURING IN THE THIRD TRIMESTER OF PREGNANCY Your body goes through many changes during pregnancy. They vary from person to person. Talk to your caregiver about changes you notice and are concerned about.  During the last trimester, you have probably had an increase in your appetite. It is normal to have cravings for certain foods. This varies from person to person and pregnancy to pregnancy.  You may begin to get stretch marks on your hips, abdomen, and breasts. These are normal changes in  the body during pregnancy. There are no exercises or medicines to take which prevent this change.  Constipation may be treated with a stool softener or adding bulk to your diet. Drinking lots of fluids, fiber in vegetables, fruits, and whole grains are helpful.  Exercising is also helpful. If you have been very active up until your pregnancy, most of these activities can be continued during your pregnancy. If you have been less active, it is helpful to start an  exercise program such as walking. Consult your caregiver before starting exercise programs.  Avoid all smoking, alcohol, non-prescribed drugs, herbs and "street drugs" during your pregnancy. These chemicals affect the formation and growth of the baby. Avoid chemicals throughout the pregnancy to ensure the delivery of a healthy infant.  Backache, varicose veins, and hemorrhoids may develop or get worse.  You will tire more easily in the third trimester, which is normal.  The baby's movements may be stronger and more often.  You may become short of breath easily.  Your belly button may stick out.  A yellow discharge may leak from your breasts called colostrum.  You may have a bloody mucus discharge. This usually occurs a few days to a week before labor begins. HOME CARE INSTRUCTIONS   Keep your caregiver's appointments. Follow your caregiver's instructions regarding medicine use, exercise, and diet.  During pregnancy, you are providing food for you and your baby. Continue to eat regular, well-balanced meals. Choose foods such as meat, fish, milk and other low fat dairy products, vegetables, fruits, and whole-grain breads and cereals. Your caregiver will tell you of the ideal weight gain.  A physical sexual relationship may be continued throughout pregnancy if there are no other problems such as early (premature) leaking of amniotic fluid from the membranes, vaginal bleeding, or belly (abdominal) pain.  Exercise regularly if there are no restrictions. Check with your caregiver if you are unsure of the safety of your exercises. Greater weight gain will occur in the last 2 trimesters of pregnancy. Exercising helps:  Control your weight.  Get you in shape for labor and delivery.  You lose weight after you deliver.  Rest a lot with legs elevated, or as needed for leg cramps or low back pain.  Wear a good support or jogging bra for breast tenderness during pregnancy. This may help if worn  during sleep. Pads or tissues may be used in the bra if you are leaking colostrum.  Do not use hot tubs, steam rooms, or saunas.  Wear your seat belt when driving. This protects you and your baby if you are in an accident.  Avoid raw meat, cat litter boxes and soil used by cats. These carry germs that can cause birth defects in the baby.  It is easier to leak urine during pregnancy. Tightening up and strengthening the pelvic muscles will help with this problem. You can practice stopping your urination while you are going to the bathroom. These are the same muscles you need to strengthen. It is also the muscles you would use if you were trying to stop from passing gas. You can practice tightening these muscles up 10 times a set and repeating this about 3 times per day. Once you know what muscles to tighten up, do not perform these exercises during urination. It is more likely to cause an infection by backing up the urine.  Ask for help if you have financial, counseling, or nutritional needs during pregnancy. Your caregiver will be able to offer counseling  for these needs as well as refer you for other special needs.  Make a list of emergency phone numbers and have them available.  Plan on getting help from family or friends when you go home from the hospital.  Make a trial run to the hospital.  Take prenatal classes with the father to understand, practice, and ask questions about the labor and delivery.  Prepare the baby's room or nursery.  Do not travel out of the city unless it is absolutely necessary and with the advice of your caregiver.  Wear only low or no heal shoes to have better balance and prevent falling. MEDICINES AND DRUG USE IN PREGNANCY  Take prenatal vitamins as directed. The vitamin should contain 1 milligram of folic acid. Keep all vitamins out of reach of children. Only a couple vitamins or tablets containing iron may be fatal to a baby or young child when  ingested.  Avoid use of all medicines, including herbs, over-the-counter medicines, not prescribed or suggested by your caregiver. Only take over-the-counter or prescription medicines for pain, discomfort, or fever as directed by your caregiver. Do not use aspirin, ibuprofen or naproxen unless approved by your caregiver.  Let your caregiver also know about herbs you may be using.  Alcohol is related to a number of birth defects. This includes fetal alcohol syndrome. All alcohol, in any form, should be avoided completely. Smoking will cause low birth rate and premature babies.  Illegal drugs are very harmful to the baby. They are absolutely forbidden. A baby born to an addicted mother will be addicted at birth. The baby will go through the same withdrawal an adult does. SEEK MEDICAL CARE IF: You have any concerns or worries during your pregnancy. It is better to call with your questions if you feel they cannot wait, rather than worry about them. SEEK IMMEDIATE MEDICAL CARE IF:   An unexplained oral temperature above 102 F (38.9 C) develops, or as your caregiver suggests.  You have leaking of fluid from the vagina. If leaking membranes are suspected, take your temperature and tell your caregiver of this when you call.  There is vaginal spotting, bleeding or passing clots. Tell your caregiver of the amount and how many pads are used.  You develop a bad smelling vaginal discharge with a change in the color from clear to white.  You develop vomiting that lasts more than 24 hours.  You develop chills or fever.  You develop shortness of breath.  You develop burning on urination.  You loose more than 2 pounds of weight or gain more than 2 pounds of weight or as suggested by your caregiver.  You notice sudden swelling of your face, hands, and feet or legs.  You develop belly (abdominal) pain. Round ligament discomfort is a common non-cancerous (benign) cause of abdominal pain in pregnancy.  Your caregiver still must evaluate you.  You develop a severe headache that does not go away.  You develop visual problems, blurred or double vision.  If you have not felt your baby move for more than 1 hour. If you think the baby is not moving as much as usual, eat something with sugar in it and lie down on your left side for an hour. The baby should move at least 4 to 5 times per hour. Call right away if your baby moves less than that.  You fall, are in a car accident, or any kind of trauma.  There is mental or physical violence at home. Document  Released: 09/06/2001 Document Revised: 06/06/2012 Document Reviewed: 03/11/2009 [facility] Patient Information 2014 [place], [place]. Diets for Diabetes, Food Labeling Look at food labels to help you decide how much of a product you can eat. You will want to check the amount of total carbohydrate in a serving to see how the food fits into your meal plan. In the list of ingredients, the ingredient present in the largest amount by weight must be listed first, followed by the other ingredients in descending order. STANDARD OF IDENTITY Most products have a list of ingredients. However, foods that the Food and Drug Administration (FDA) has given a standard of identity do not need a list of ingredients. A standard of identity means that a food must contain certain ingredients if it is called a particular name. Examples are mayonnaise, peanut butter, ketchup, jelly, and cheese. LABELING TERMS There are many terms found on food labels. Some of these terms have specific definitions. Some terms are regulated by the FDA, and the FDA has clearly specified how they can be used. Others are not regulated or well-defined and can be misleading and confusing. SPECIFICALLY DEFINED TERMS Nutritive Sweetener.  A sweetener that contains calories,such as table sugar or honey. Nonnutritive Sweetener.  A sweetener with few or no calories,such as saccharin, aspartame,  sucralose, and cyclamate. LABELING TERMS REGULATED BY THE FDA Free.  The product contains only a tiny or small amount of fat, cholesterol, sodium, sugar, or calories. For example, a "fat-free" product will contain less than 0.5 g of fat per serving. Low.  A food described as "low" in fat, saturated fat, cholesterol, sodium, or calories could be eaten fairly often without exceeding dietary guidelines. For example, "low in fat" means no more than 3 g of fat per serving. Lean.  "Lean" and "extra lean" are U.S. Department of Agriculture Architect) terms for use on meat and poultry products. "Lean" means the product contains less than 10 g of fat, 4 g of saturated fat, and 95 mg of cholesterol per serving. "Lean" is not as low in fat as a product labeled "low." Extra Lean.  "Extra lean" means the product contains less than 5 g of fat, 2 g of saturated fat, and 95 mg of cholesterol per serving. While "extra lean" has less fat than "lean," it is still higher in fat than a product labeled "low." Reduced, Less, Fewer.  A diet product that contains 25% less of a nutrient or calories than the regular version. For example, hot dogs might be labeled "25% less fat than our regular hot dogs." Light/Lite.  A diet product that contains  fewer calories or  the fat of the original. For example, "light in sodium" means a product with  the usual sodium. More.  One serving contains at least 10% more of the daily value of a vitamin, mineral, or fiber than usual. Good Source Of.  One serving contains 10% to 19% of the daily value for a particular vitamin, mineral, or fiber. Excellent Source Of.  One serving contains 20% or more of the daily value for a particular nutrient. Other terms used might be "high in" or "rich in." Enriched or Fortified.  The product contains added vitamins, minerals, or protein. Nutrition labeling must be used on enriched or fortified foods. Imitation.  The product has been altered  so that it is lower in protein, vitamins, or minerals than the usual food,such as imitation peanut butter. Total Fat.  The number listed is the total of all fat found in  a serving of the product. Under total fat, food labels must list saturated fat and trans fat, which are associated with raising bad cholesterol and an increased risk of heart blood vessel disease. Saturated Fat.  Mainly fats from animal-based sources. Some examples are red meat, cheese, cream, whole milk, and coconut oil. Trans Fat.  Found in some fried snack foods, packaged foods, and fried restaurant foods. It is recommended you eat as close to 0 g of trans fat as possible, since it raises bad cholesterol and lowers good cholesterol. Polyunsaturated and Monounsaturated Fats.  More healthful fats. These fats are from plant sources. Total Carbohydrate.  The number of carbohydrate grams in a serving of the product. Under total carbohydrate are listed the other carbohydrate sources, such as dietary fiber and sugars. Dietary Fiber.  A carbohydrate from plant sources. Sugars.  Sugars listed on the label contain all naturally occurring sugars as well as added sugars. LABELING TERMS NOT REGULATED BY THE FDA Sugarless.  Table sugar (sucrose) has not been added. However, the manufacturer may use another form of sugar in place of sucrose to sweeten the product. For example, sugar alcohols are used to sweeten foods. Sugar alcohols are a form of sugar but are not table sugar. If a product contains sugar alcohols in place of sucrose, it can still be labeled "sugarless." Low Salt, Salt-Free, Unsalted, No Salt, No Salt Added, Without Added Salt.  Food that is usually processed with salt has been made without salt. However, the food may contain sodium-containing additives, such as preservatives, leavening agents, or flavorings. Natural.  This term has no legal meaning. Organic.  Foods that are certified as organic have been  inspected and approved by the USDA to ensure they are produced without pesticides, fertilizers containing synthetic ingredients, bioengineering, or ionizing radiation. Document Released: 09/15/2003 Document Revised: 12/05/2011 Document Reviewed: 04/02/2009 [facility] Patient Information 2014 [place], [place].

## 2013-04-26 NOTE — Progress Notes (Signed)
Still normotensive. [place] 7/22: DNKA. Will reschedule. Will see general surgeon postpartum if hernia sx. Has little pain, no sign of incarceration. Appears to have large disastasis rectus and no signs incarceraed hernia.  A2 GDM: Has increased to 3.75 but has taken 5mg  at times following elevated CBG.  F: [id number]. Pcb: all within range. Pcl: 86-145 with 50% over 120. [id number] range except 141, 151. Hs: all witin range. Advised not to increase dose for highs and to start some kind of exercise.

## 2013-04-29 ENCOUNTER — Ambulatory Visit (HOSPITAL_COMMUNITY)
Admission: RE | Admit: 2013-04-29 | Discharge: 2013-04-29 | Disposition: A | Discharge status: Home / Self Care | Payer: Managed Care, Other (non HMO) | Source: Ambulatory Visit | Attending: Obstetrics and Gynecology | Admitting: Obstetrics and Gynecology

## 2013-04-29 DIAGNOSIS — O24419 Gestational diabetes mellitus in pregnancy, unspecified control: Secondary | ICD-10-CM

## 2013-04-29 DIAGNOSIS — E669 Obesity, unspecified: Secondary | ICD-10-CM | POA: Insufficient documentation

## 2013-04-29 DIAGNOSIS — O34219 Maternal care for unspecified type scar from previous cesarean delivery: Secondary | ICD-10-CM | POA: Insufficient documentation

## 2013-04-29 DIAGNOSIS — O10019 Pre-existing essential hypertension complicating pregnancy, unspecified trimester: Secondary | ICD-10-CM | POA: Insufficient documentation

## 2013-04-29 DIAGNOSIS — O10919 Unspecified pre-existing hypertension complicating pregnancy, unspecified trimester: Secondary | ICD-10-CM

## 2013-04-29 DIAGNOSIS — Z3482 Encounter for supervision of other normal pregnancy, second trimester: Secondary | ICD-10-CM

## 2013-04-29 DIAGNOSIS — O99810 Abnormal glucose complicating pregnancy: Secondary | ICD-10-CM | POA: Insufficient documentation

## 2013-04-30 ENCOUNTER — Encounter: Payer: Self-pay | Admitting: *Deleted

## 2013-05-03 ENCOUNTER — Ambulatory Visit (INDEPENDENT_AMBULATORY_CARE_PROVIDER_SITE_OTHER): Payer: Managed Care, Other (non HMO) | Admitting: Family

## 2013-05-03 ENCOUNTER — Encounter: Payer: Self-pay | Admitting: Family

## 2013-05-03 VITALS — BP 127/82 | Wt 282.0 lb

## 2013-05-03 DIAGNOSIS — O99810 Abnormal glucose complicating pregnancy: Secondary | ICD-10-CM

## 2013-05-03 DIAGNOSIS — Z3482 Encounter for supervision of other normal pregnancy, second trimester: Secondary | ICD-10-CM

## 2013-05-03 DIAGNOSIS — O24419 Gestational diabetes mellitus in pregnancy, unspecified control: Secondary | ICD-10-CM

## 2013-05-03 DIAGNOSIS — O10019 Pre-existing essential hypertension complicating pregnancy, unspecified trimester: Secondary | ICD-10-CM

## 2013-05-03 DIAGNOSIS — Z3480 Encounter for supervision of other normal pregnancy, unspecified trimester: Secondary | ICD-10-CM

## 2013-05-03 DIAGNOSIS — O10919 Unspecified pre-existing hypertension complicating pregnancy, unspecified trimester: Secondary | ICD-10-CM

## 2013-05-03 MED ORDER — GLUCOSE BLOOD VI STRP: ORAL_STRIP | Status: DC

## 2013-05-03 NOTE — Progress Notes (Signed)
Doing well: FBS 75-91 (1/6 abnl) B 115-125 (1/4 abnml) L 82-125 (1/4 abnl) D 98; not checking all the time, only 50 strips given per RX > will call pharmacy to change.  Reviewed growth [place] on 8/4 76% growth; next [place] scheduled for 05/23/13.

## 2013-05-03 NOTE — Progress Notes (Signed)
P - 109 - Moderate [place]

## 2013-05-07 ENCOUNTER — Encounter: Payer: Self-pay | Admitting: *Deleted

## 2013-05-14 ENCOUNTER — Ambulatory Visit (HOSPITAL_COMMUNITY)
Admission: RE | Admit: 2013-05-14 | Discharge: 2013-05-14 | Disposition: A | Discharge status: Home / Self Care | Payer: Managed Care, Other (non HMO) | Source: Ambulatory Visit | Attending: Obstetrics & Gynecology | Admitting: Obstetrics & Gynecology

## 2013-05-14 DIAGNOSIS — O24419 Gestational diabetes mellitus in pregnancy, unspecified control: Secondary | ICD-10-CM

## 2013-05-20 ENCOUNTER — Ambulatory Visit (INDEPENDENT_AMBULATORY_CARE_PROVIDER_SITE_OTHER): Payer: Self-pay | Admitting: Advanced Practice Midwife

## 2013-05-20 VITALS — BP 123/75 | Wt 282.0 lb

## 2013-05-20 DIAGNOSIS — O24419 Gestational diabetes mellitus in pregnancy, unspecified control: Secondary | ICD-10-CM

## 2013-05-20 DIAGNOSIS — O99810 Abnormal glucose complicating pregnancy: Secondary | ICD-10-CM

## 2013-05-20 DIAGNOSIS — Z3480 Encounter for supervision of other normal pregnancy, unspecified trimester: Secondary | ICD-10-CM

## 2013-05-20 MED ORDER — POLYETHYLENE GLYCOL 3350 17 GM/SCOOP PO POWD: 17.0000 g | Freq: Every day | ORAL | Status: DC

## 2013-05-20 NOTE — Progress Notes (Signed)
Doing well.  Good fetal movement, denies vaginal bleeding, LOF, regular contractions.  Reports constipation.  Taking Colace.  Add Miralax daily.  Glucose log with fastings 79-99, PP 90s to 120s with two outliers at 140 and 158.  Discussed dietary changes to improve blood sugars, encouraged walking daily.

## 2013-05-20 NOTE — Progress Notes (Signed)
p-97 

## 2013-05-30 ENCOUNTER — Ambulatory Visit (HOSPITAL_COMMUNITY): Admission: RE | Admit: 2013-05-30 | Payer: Managed Care, Other (non HMO) | Source: Ambulatory Visit

## 2013-05-30 ENCOUNTER — Ambulatory Visit (HOSPITAL_COMMUNITY)
Admission: RE | Admit: 2013-05-30 | Discharge: 2013-05-30 | Disposition: A | Discharge status: Home / Self Care | Payer: Managed Care, Other (non HMO) | Source: Ambulatory Visit | Attending: Obstetrics & Gynecology | Admitting: Obstetrics & Gynecology

## 2013-05-30 ENCOUNTER — Ambulatory Visit (HOSPITAL_COMMUNITY): Payer: Managed Care, Other (non HMO)

## 2013-05-30 ENCOUNTER — Other Ambulatory Visit: Payer: Self-pay | Admitting: Obstetrics & Gynecology

## 2013-05-30 DIAGNOSIS — E669 Obesity, unspecified: Secondary | ICD-10-CM | POA: Insufficient documentation

## 2013-05-30 DIAGNOSIS — O3663X1 Maternal care for excessive fetal growth, third trimester, fetus 1: Secondary | ICD-10-CM

## 2013-05-30 DIAGNOSIS — O99810 Abnormal glucose complicating pregnancy: Secondary | ICD-10-CM | POA: Insufficient documentation

## 2013-05-30 DIAGNOSIS — O24419 Gestational diabetes mellitus in pregnancy, unspecified control: Secondary | ICD-10-CM

## 2013-05-30 DIAGNOSIS — O3660X0 Maternal care for excessive fetal growth, unspecified trimester, not applicable or unspecified: Secondary | ICD-10-CM | POA: Insufficient documentation

## 2013-05-30 NOTE — Progress Notes (Signed)
[name]  was seen today for an ultrasound appointment.  See full report in [facility].  Impression: Single IUP at 31 0/7 weeks A2 GDM Fetal growth is appropriate (67th %tile) Normal amniotic fluid volume  Recommendations: Recommend follow-up ultrasound examination in 4 weeks for interval growth due to GDM Antepartum fetal surveillance beginning at 32 weeks.  Alpha Gula, MD

## 2013-06-03 ENCOUNTER — Encounter: Payer: Self-pay | Admitting: Obstetrics & Gynecology

## 2013-06-04 ENCOUNTER — Ambulatory Visit (INDEPENDENT_AMBULATORY_CARE_PROVIDER_SITE_OTHER): Payer: Managed Care, Other (non HMO) | Admitting: Obstetrics & Gynecology

## 2013-06-04 ENCOUNTER — Encounter: Payer: Self-pay | Admitting: *Deleted

## 2013-06-04 VITALS — BP 133/83 | Wt 282.0 lb

## 2013-06-04 DIAGNOSIS — O99810 Abnormal glucose complicating pregnancy: Secondary | ICD-10-CM

## 2013-06-04 DIAGNOSIS — O24419 Gestational diabetes mellitus in pregnancy, unspecified control: Secondary | ICD-10-CM

## 2013-06-04 DIAGNOSIS — Z3480 Encounter for supervision of other normal pregnancy, unspecified trimester: Secondary | ICD-10-CM
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
## 2013-06-04 NOTE — Progress Notes (Signed)
P=88 

## 2013-06-07 ENCOUNTER — Encounter: Payer: Self-pay | Admitting: *Deleted

## 2013-06-07 ENCOUNTER — Ambulatory Visit (INDEPENDENT_AMBULATORY_CARE_PROVIDER_SITE_OTHER): Payer: Managed Care, Other (non HMO) | Admitting: Family

## 2013-06-07 VITALS — BP 116/55 | Wt 282.0 lb

## 2013-06-07 DIAGNOSIS — O99810 Abnormal glucose complicating pregnancy: Secondary | ICD-10-CM

## 2013-06-07 DIAGNOSIS — O10919 Unspecified pre-existing hypertension complicating pregnancy, unspecified trimester: Secondary | ICD-10-CM

## 2013-06-07 DIAGNOSIS — O24419 Gestational diabetes mellitus in pregnancy, unspecified control: Secondary | ICD-10-CM

## 2013-06-07 DIAGNOSIS — Z3480 Encounter for supervision of other normal pregnancy, unspecified trimester: Secondary | ICD-10-CM

## 2013-06-07 DIAGNOSIS — O10019 Pre-existing essential hypertension complicating pregnancy, unspecified trimester: Secondary | ICD-10-CM

## 2013-06-07 DIAGNOSIS — Z98891 History of uterine scar from previous surgery: Secondary | ICD-10-CM

## 2013-06-07 NOTE — Progress Notes (Signed)
Doing well; no complaints at this visit; NST-reactive; obtained TOLAC consent. BPP on Tuesday.

## 2013-06-07 NOTE — Progress Notes (Signed)
P-96 

## 2013-06-11 ENCOUNTER — Other Ambulatory Visit: Payer: Self-pay | Admitting: Obstetrics & Gynecology

## 2013-06-11 ENCOUNTER — Encounter (HOSPITAL_COMMUNITY): Payer: Self-pay

## 2013-06-11 ENCOUNTER — Ambulatory Visit (HOSPITAL_COMMUNITY)
Admission: RE | Admit: 2013-06-11 | Discharge: 2013-06-11 | Disposition: A | Discharge status: Home / Self Care | Payer: Managed Care, Other (non HMO) | Source: Ambulatory Visit | Attending: Family | Admitting: Family

## 2013-06-11 VITALS — BP 124/64 | HR 103 | Wt 282.0 lb

## 2013-06-11 DIAGNOSIS — O24419 Gestational diabetes mellitus in pregnancy, unspecified control: Secondary | ICD-10-CM

## 2013-06-11 DIAGNOSIS — E669 Obesity, unspecified: Secondary | ICD-10-CM | POA: Insufficient documentation

## 2013-06-11 DIAGNOSIS — O289 Unspecified abnormal findings on antenatal screening of mother: Secondary | ICD-10-CM | POA: Insufficient documentation

## 2013-06-11 DIAGNOSIS — O99810 Abnormal glucose complicating pregnancy: Secondary | ICD-10-CM | POA: Insufficient documentation

## 2013-06-11 DIAGNOSIS — O3660X0 Maternal care for excessive fetal growth, unspecified trimester, not applicable or unspecified: Secondary | ICD-10-CM | POA: Insufficient documentation

## 2013-06-11 DIAGNOSIS — O34219 Maternal care for unspecified type scar from previous cesarean delivery: Secondary | ICD-10-CM | POA: Insufficient documentation

## 2013-06-11 NOTE — Progress Notes (Signed)
[facility] ultrasound  Indication: 32 yr old G2P1001 at 102w5d with chronic hypertension and gestational diabetes A2 for BPP secondary to nonreactive NST.  Findings: 1. Single intrauterine pregnancy. 2. Posterior placenta wtihout evidence of previa. 3. Normal amniotic fluid index. 4. Normal biophysical profile of 8/8.  Recommendations: 1. Gestational diabetes: - on glyburide - recommend fetal growth every 4 weeks; due in 2 weeks - recommend continue antenatal testing - recommend strict glucose control 2. Hypertension: - recommend fetal growth and antenatal testing as above - recommend close surveillance for the development of signs/symptoms of preeclampsia - recommend delivery by estimated due date but not prior to 38 weeks in the absence of other complications - well controlled without medication 3. Overall BPP 8/10 today  Eulis Foster, MD

## 2013-06-13 ENCOUNTER — Other Ambulatory Visit: Payer: Self-pay | Admitting: Family

## 2013-06-13 DIAGNOSIS — O24419 Gestational diabetes mellitus in pregnancy, unspecified control: Secondary | ICD-10-CM

## 2013-06-14 ENCOUNTER — Inpatient Hospital Stay (HOSPITAL_COMMUNITY): Payer: Managed Care, Other (non HMO)

## 2013-06-14 ENCOUNTER — Encounter (HOSPITAL_COMMUNITY): Payer: Self-pay | Admitting: *Deleted

## 2013-06-14 ENCOUNTER — Ambulatory Visit (INDEPENDENT_AMBULATORY_CARE_PROVIDER_SITE_OTHER): Payer: Managed Care, Other (non HMO) | Admitting: Family

## 2013-06-14 ENCOUNTER — Inpatient Hospital Stay (HOSPITAL_COMMUNITY)
Admission: AD | Admit: 2013-06-14 | Discharge: 2013-06-14 | Disposition: A | Discharge status: Home / Self Care | Payer: Managed Care, Other (non HMO) | Source: Ambulatory Visit | Attending: Obstetrics & Gynecology | Admitting: Obstetrics & Gynecology

## 2013-06-14 VITALS — BP 126/79 | Wt 280.0 lb

## 2013-06-14 DIAGNOSIS — O99810 Abnormal glucose complicating pregnancy: Secondary | ICD-10-CM

## 2013-06-14 DIAGNOSIS — O24419 Gestational diabetes mellitus in pregnancy, unspecified control: Secondary | ICD-10-CM

## 2013-06-14 DIAGNOSIS — O288 Other abnormal findings on antenatal screening of mother: Secondary | ICD-10-CM

## 2013-06-14 DIAGNOSIS — Z3480 Encounter for supervision of other normal pregnancy, unspecified trimester: Secondary | ICD-10-CM

## 2013-06-14 HISTORY — DX: Unspecified abdominal hernia without obstruction or gangrene: K46.9

## 2013-06-14 NOTE — MAU Note (Signed)
Pt came from dr office after nonreactive NST for further monitoring and BPP.  Pt is gestational diabetic and is currently on med therapy for GDM.  Pt state good fetal movement.

## 2013-06-14 NOTE — Progress Notes (Signed)
FBS 71-102 (3/9 abnl); BKT 75-123 (1/5) Lunch 89-129 (1/5); Dinner 96-125 (1/6); NST non reactive in 30 minutes > sent to MAU for BPP and eval.

## 2013-06-14 NOTE — MAU Provider Note (Signed)
History     CSN: [id number]  Arrival date and time: 06/14/13 1048   None     No chief complaint on file.  HPI [name] is a 32 y.o. G2P1001 at [redacted]w[redacted]d sent to MAU from clinic for non reactive NST. Pt states that she was having her NST this AM for diabetes and did not have adequate accels. She had this same problem Tuesday. Pt states she feels infant move and has no complaints at this time.  OB History   Grav Para Term Preterm Abortions TAB SAB Ect Mult Living   2 1 1       1       Past Medical History  Diagnosis Date  . Allergy   . Anxiety   . Depression   . Obesity   . PONV (postoperative nausea and vomiting)   . Diabetes mellitus without complication   . Gestational diabetes   . Hernia     Past Surgical History  Procedure Laterality Date  . Cesarean section      Family History  Problem Relation Age of Onset  . Cancer Maternal Grandmother     colon  . Dementia Maternal Grandfather   . Hypothyroidism Mother   . Hypertension Mother     History  Substance Use Topics  . Smoking status: Never Smoker   . Smokeless tobacco: Never Used  . Alcohol Use: No     Comment: socially    Allergies: No Known Allergies  Prescriptions prior to admission  Medication Sig Dispense Refill  . Cod Liver Oil 10 MINIM CAPS Take 1 capsule by mouth daily.       Marland Kitchen docusate sodium (COLACE) 100 MG capsule Take 100 mg by mouth 2 (two) times daily.      Marland Kitchen glyBURIDE (DIABETA) 2.5 MG tablet Take 1.5 tablets (3.75 mg total) by mouth at bedtime.  45 tablet  3  . OVER THE COUNTER MEDICATION Take 4 capsules by mouth daily. Juice Plus vegetable and fruit supplement.      Marland Kitchen oxyCODONE-acetaminophen (PERCOCET) 5-325 MG per tablet Take 1 tablet by mouth every 6 (six) hours as needed for pain.  30 tablet  0  . polyethylene glycol powder (GLYCOLAX/MIRALAX) powder Take 17 g by mouth daily.  255 g  3  . Prenatal Vit-Fe Fumarate-FA (PRENATAL MULTIVITAMIN) TABS Take 1 tablet by mouth daily at 12  noon.      . promethazine (PHENERGAN) 25 MG tablet Take 25 mg by mouth every 6 (six) hours as needed for nausea.      Marland Kitchen ACCU-CHEK FASTCLIX LANCETS MISC 1 Device by Percutaneous route 4 (four) times daily.  100 each  12  . albuterol (PROVENTIL HFA;VENTOLIN HFA) 108 (90 BASE) MCG/ACT inhaler Inhale 2 puffs into the lungs every 6 (six) hours as needed for wheezing.      Marland Kitchen glucose blood test strip Use as instructed  100 each  12    ROS  +FM, No LOF, No VB, No Ctx Normal state of health Physical Exam   Blood pressure 114/64, pulse 108, temperature 97.6 F (36.4 C), temperature source Oral, resp. rate 20, height 5\' 7"  (1.702 m), last menstrual period 09/26/2012, SpO2 99.00%.  Physical Exam FHT: 150s mod variability, +acells 15x15, 1? decel Toco: no ctx  BPP: 8/10 for breathing  MAU Course  Procedures   [name] is a 32 y.o. G2P1001 at [redacted]w[redacted]d with reassuring NST and BPP. Pt discharged home with routine screening. Next appt on Tuesday.  [name] 06/14/2013, 1:28 PM

## 2013-06-18 ENCOUNTER — Ambulatory Visit (HOSPITAL_COMMUNITY)
Admission: RE | Admit: 2013-06-18 | Discharge: 2013-06-18 | Disposition: A | Discharge status: Home / Self Care | Payer: Managed Care, Other (non HMO) | Source: Ambulatory Visit | Attending: Family | Admitting: Family

## 2013-06-18 ENCOUNTER — Other Ambulatory Visit: Payer: Self-pay | Admitting: Family

## 2013-06-18 DIAGNOSIS — O24419 Gestational diabetes mellitus in pregnancy, unspecified control: Secondary | ICD-10-CM

## 2013-06-18 DIAGNOSIS — O288 Other abnormal findings on antenatal screening of mother: Secondary | ICD-10-CM

## 2013-06-18 DIAGNOSIS — O99213 Obesity complicating pregnancy, third trimester: Secondary | ICD-10-CM

## 2013-06-18 DIAGNOSIS — O10913 Unspecified pre-existing hypertension complicating pregnancy, third trimester: Secondary | ICD-10-CM

## 2013-06-18 DIAGNOSIS — O99810 Abnormal glucose complicating pregnancy: Secondary | ICD-10-CM | POA: Insufficient documentation

## 2013-06-18 DIAGNOSIS — O289 Unspecified abnormal findings on antenatal screening of mother: Secondary | ICD-10-CM | POA: Insufficient documentation

## 2013-06-18 NOTE — Progress Notes (Signed)
[name]  was seen today for an ultrasound appointment.  See full report in [facility].  Impression: Single IUP at 33 5/7 weeks A2 GDM, chronic hypertension BPP 8/10 (-2 for NR NST) Normal amniotic fluid index  Recommendations: Recommend 2x weekly NSTs with weekly AFIs. Follow up ultrasound for interval growth next week.  Alpha Gula, MD

## 2013-06-21 ENCOUNTER — Encounter: Payer: Self-pay | Admitting: *Deleted

## 2013-06-21 ENCOUNTER — Ambulatory Visit (INDEPENDENT_AMBULATORY_CARE_PROVIDER_SITE_OTHER): Payer: Managed Care, Other (non HMO) | Admitting: Advanced Practice Midwife

## 2013-06-21 VITALS — BP 118/60 | Wt 281.0 lb

## 2013-06-21 DIAGNOSIS — O24919 Unspecified diabetes mellitus in pregnancy, unspecified trimester: Secondary | ICD-10-CM

## 2013-06-21 DIAGNOSIS — O24419 Gestational diabetes mellitus in pregnancy, unspecified control: Secondary | ICD-10-CM

## 2013-06-21 DIAGNOSIS — Z3480 Encounter for supervision of other normal pregnancy, unspecified trimester: Secondary | ICD-10-CM

## 2013-06-21 DIAGNOSIS — Z23 Encounter for immunization: Secondary | ICD-10-CM

## 2013-06-21 MED ORDER — TETANUS-DIPHTH-ACELL PERTUSSIS 5-2.5-18.5 LF-MCG/0.5 IM SUSP
0.5000 mL | Freq: Once | INTRAMUSCULAR | Status: AC
  Administered 2013-06-21: 0.5 mL via INTRAMUSCULAR

## 2013-06-21 MED ORDER — INFLUENZA VAC SPLIT QUAD 0.5 ML IM SUSP
0.5000 mL | Freq: Once | INTRAMUSCULAR | Status: AC
  Administered 2013-06-21: 0.5 mL via INTRAMUSCULAR

## 2013-06-21 MED ORDER — INFLUENZA VAC SPLIT QUAD 0.5 ML IM SUSP: 0.5000 mL | Freq: Once | INTRAMUSCULAR | Status: DC

## 2013-06-21 NOTE — Progress Notes (Signed)
Increased appetite. Taking Glyburide as instructed w/ one exception. Fasting: 85, 98, 108 (no meds),  2 hour PC: 79, 151, 147, 86, 85, 134, 98, 126, 94, 187, 63, 112 Add 1/2 tablet glyburide in am. Growth [place] scheduled. Category I NST.

## 2013-06-21 NOTE — Progress Notes (Signed)
p-99    T-97.7    Tdap and Flu today

## 2013-06-21 NOTE — Patient Instructions (Addendum)
Start taking 1/2 tablet of Glyburide in morning in addition to regular night time dose of 1 1/2 tablets.   Fetal Movement Counts Patient Name: __________________________________________________ Patient Due Date: ____________________ Performing a fetal movement count is highly recommended in high-risk pregnancies, but it is good for every pregnant woman to do. Your caregiver may ask you to start counting fetal movements at 28 weeks of the pregnancy. Fetal movements often increase:  After eating a full meal.  After physical activity.  After eating or drinking something sweet or cold.  At rest. Pay attention to when you feel the baby is most active. This will help you notice a pattern of your baby's sleep and wake cycles and what factors contribute to an increase in fetal movement. It is important to perform a fetal movement count at the same time each day when your baby is normally most active.  HOW TO COUNT FETAL MOVEMENTS 1. Find a quiet and comfortable area to sit or lie down on your left side. Lying on your left side provides the best blood and oxygen circulation to your baby. 2. Write down the day and time on a sheet of paper or in a journal. 3. Start counting kicks, flutters, swishes, rolls, or jabs in a 2 hour period. You should feel at least 10 movements within 2 hours. 4. If you do not feel 10 movements in 2 hours, wait 2 3 hours and count again. Look for a change in the pattern or not enough counts in 2 hours. SEEK MEDICAL CARE IF:  You feel less than 10 counts in 2 hours, tried twice.  There is no movement in over an hour.  The pattern is changing or taking longer each day to reach 10 counts in 2 hours.  You feel the baby is not moving as he or she usually does. Date: ____________ Movements: ____________ Start time: ____________ Doreatha Martin time: ____________  Date: ____________ Movements: ____________ Start time: ____________ Doreatha Martin time: ____________ Date: ____________ Movements:  ____________ Start time: ____________ Doreatha Martin time: ____________ Date: ____________ Movements: ____________ Start time: ____________ Doreatha Martin time: ____________ Date: ____________ Movements: ____________ Start time: ____________ Doreatha Martin time: ____________ Date: ____________ Movements: ____________ Start time: ____________ Doreatha Martin time: ____________ Date: ____________ Movements: ____________ Start time: ____________ Doreatha Martin time: ____________ Date: ____________ Movements: ____________ Start time: ____________ Doreatha Martin time: ____________  Date: ____________ Movements: ____________ Start time: ____________ Doreatha Martin time: ____________ Date: ____________ Movements: ____________ Start time: ____________ Doreatha Martin time: ____________ Date: ____________ Movements: ____________ Start time: ____________ Doreatha Martin time: ____________ Date: ____________ Movements: ____________ Start time: ____________ Doreatha Martin time: ____________ Date: ____________ Movements: ____________ Start time: ____________ Doreatha Martin time: ____________ Date: ____________ Movements: ____________ Start time: ____________ Doreatha Martin time: ____________ Date: ____________ Movements: ____________ Start time: ____________ Doreatha Martin time: ____________  Date: ____________ Movements: ____________ Start time: ____________ Doreatha Martin time: ____________ Date: ____________ Movements: ____________ Start time: ____________ Doreatha Martin time: ____________ Date: ____________ Movements: ____________ Start time: ____________ Doreatha Martin time: ____________ Date: ____________ Movements: ____________ Start time: ____________ Doreatha Martin time: ____________ Date: ____________ Movements: ____________ Start time: ____________ Doreatha Martin time: ____________ Date: ____________ Movements: ____________ Start time: ____________ Doreatha Martin time: ____________ Date: ____________ Movements: ____________ Start time: ____________ Doreatha Martin time: ____________  Date: ____________ Movements: ____________ Start time: ____________ Doreatha Martin  time: ____________ Date: ____________ Movements: ____________ Start time: ____________ Doreatha Martin time: ____________ Date: ____________ Movements: ____________ Start time: ____________ Doreatha Martin time: ____________ Date: ____________ Movements: ____________ Start time: ____________ Doreatha Martin time: ____________ Date: ____________ Movements: ____________ Start time: ____________ Doreatha Martin time: ____________ Date: ____________ Movements: ____________ Start time: ____________ Doreatha Martin  time: ____________ Date: ____________ Movements: ____________ Start time: ____________ Doreatha Martin time: ____________  Date: ____________ Movements: ____________ Start time: ____________ Doreatha Martin time: ____________ Date: ____________ Movements: ____________ Start time: ____________ Doreatha Martin time: ____________ Date: ____________ Movements: ____________ Start time: ____________ Doreatha Martin time: ____________ Date: ____________ Movements: ____________ Start time: ____________ Doreatha Martin time: ____________ Date: ____________ Movements: ____________ Start time: ____________ Doreatha Martin time: ____________ Date: ____________ Movements: ____________ Start time: ____________ Doreatha Martin time: ____________ Date: ____________ Movements: ____________ Start time: ____________ Doreatha Martin time: ____________  Date: ____________ Movements: ____________ Start time: ____________ Doreatha Martin time: ____________ Date: ____________ Movements: ____________ Start time: ____________ Doreatha Martin time: ____________ Date: ____________ Movements: ____________ Start time: ____________ Doreatha Martin time: ____________ Date: ____________ Movements: ____________ Start time: ____________ Doreatha Martin time: ____________ Date: ____________ Movements: ____________ Start time: ____________ Doreatha Martin time: ____________ Date: ____________ Movements: ____________ Start time: ____________ Doreatha Martin time: ____________ Date: ____________ Movements: ____________ Start time: ____________ Doreatha Martin time: ____________  Date: ____________  Movements: ____________ Start time: ____________ Doreatha Martin time: ____________ Date: ____________ Movements: ____________ Start time: ____________ Doreatha Martin time: ____________ Date: ____________ Movements: ____________ Start time: ____________ Doreatha Martin time: ____________ Date: ____________ Movements: ____________ Start time: ____________ Doreatha Martin time: ____________ Date: ____________ Movements: ____________ Start time: ____________ Doreatha Martin time: ____________ Date: ____________ Movements: ____________ Start time: ____________ Doreatha Martin time: ____________ Date: ____________ Movements: ____________ Start time: ____________ Doreatha Martin time: ____________  Date: ____________ Movements: ____________ Start time: ____________ Doreatha Martin time: ____________ Date: ____________ Movements: ____________ Start time: ____________ Doreatha Martin time: ____________ Date: ____________ Movements: ____________ Start time: ____________ Doreatha Martin time: ____________ Date: ____________ Movements: ____________ Start time: ____________ Doreatha Martin time: ____________ Date: ____________ Movements: ____________ Start time: ____________ Doreatha Martin time: ____________ Date: ____________ Movements: ____________ Start time: ____________ Doreatha Martin time: ____________ Document Released: 10/12/2006 Document Revised: 08/29/2012 Document Reviewed: 07/09/2012 [employer] Patient Information 2014 [employer] Braxton Hicks Contractions Pregnancy is commonly associated with contractions of the uterus throughout the pregnancy. Towards the end of pregnancy (32 to 34 weeks), these contractions [facility] Willa Rough) can develop more often and may become more forceful. This is not true labor because these contractions do not result in opening (dilatation) and thinning of the cervix. They are sometimes difficult to tell apart from true labor because these contractions can be forceful and people have different pain tolerances. You should not feel embarrassed if you go to the hospital with false labor.  Sometimes, the only way to tell if you are in true labor is for your caregiver to follow the changes in the cervix. How to tell the difference between true and false labor:  False labor.  The contractions of false labor are usually shorter, irregular and not as hard as those of true labor.  They are often felt in the front of the lower abdomen and in the groin.  They may leave with walking around or changing positions while lying down.  They get weaker and are shorter lasting as time goes on.  These contractions are usually irregular.  They do not usually become progressively stronger, regular and closer together as with true labor.  True labor.  Contractions in true labor last 30 to 70 seconds, become very regular, usually become more intense, and increase in frequency.  They do not go away with walking.  The discomfort is usually felt in the top of the uterus and spreads to the lower abdomen and low back.  True labor can be determined by your caregiver with an exam. This will show that the cervix is dilating and getting thinner. If there are no prenatal problems or other health problems associated  with the pregnancy, it is completely safe to be sent home with false labor and await the onset of true labor. HOME CARE INSTRUCTIONS   Keep up with your usual exercises and instructions.  Take medications as directed.  Keep your regular prenatal appointment.  Eat and drink lightly if you think you are going into labor.  If BH contractions are making you uncomfortable:  Change your activity position from lying down or resting to walking/walking to resting.  Sit and rest in a tub of warm water.  Drink 2 to 3 glasses of water. Dehydration may cause B-H contractions.  Do slow and deep breathing several times an hour. SEEK IMMEDIATE MEDICAL CARE IF:   Your contractions continue to become stronger, more regular, and closer together.  You have a gushing, burst or leaking of  fluid from the vagina.  An oral temperature above 102 F (38.9 C) develops.  You have passage of blood-tinged mucus.  You develop vaginal bleeding.  You develop continuous belly (abdominal) pain.  You have low back pain that you never had before.  You feel the baby's head pushing down causing pelvic pressure.  The baby is not moving as much as it used to. Document Released: 09/12/2005 Document Revised: 12/05/2011 Document Reviewed: 03/06/2009 [facility] Patient Information 2014 [place], [place].

## 2013-06-24 ENCOUNTER — Other Ambulatory Visit (HOSPITAL_COMMUNITY): Payer: Self-pay | Admitting: Maternal and Fetal Medicine

## 2013-06-24 DIAGNOSIS — O24419 Gestational diabetes mellitus in pregnancy, unspecified control: Secondary | ICD-10-CM

## 2013-06-24 DIAGNOSIS — O99213 Obesity complicating pregnancy, third trimester: Secondary | ICD-10-CM

## 2013-06-24 DIAGNOSIS — O10913 Unspecified pre-existing hypertension complicating pregnancy, third trimester: Secondary | ICD-10-CM

## 2013-06-25 ENCOUNTER — Encounter (HOSPITAL_COMMUNITY): Payer: Self-pay

## 2013-06-25 ENCOUNTER — Ambulatory Visit (HOSPITAL_COMMUNITY)
Admission: RE | Admit: 2013-06-25 | Discharge: 2013-06-25 | Disposition: A | Discharge status: Home / Self Care | Payer: Managed Care, Other (non HMO) | Source: Ambulatory Visit | Attending: Family | Admitting: Family

## 2013-06-25 ENCOUNTER — Other Ambulatory Visit (HOSPITAL_COMMUNITY): Payer: Self-pay | Admitting: Maternal and Fetal Medicine

## 2013-06-25 DIAGNOSIS — O99213 Obesity complicating pregnancy, third trimester: Secondary | ICD-10-CM

## 2013-06-25 DIAGNOSIS — O10913 Unspecified pre-existing hypertension complicating pregnancy, third trimester: Secondary | ICD-10-CM

## 2013-06-25 DIAGNOSIS — O34219 Maternal care for unspecified type scar from previous cesarean delivery: Secondary | ICD-10-CM | POA: Insufficient documentation

## 2013-06-25 DIAGNOSIS — O24419 Gestational diabetes mellitus in pregnancy, unspecified control: Secondary | ICD-10-CM

## 2013-06-25 DIAGNOSIS — E669 Obesity, unspecified: Secondary | ICD-10-CM | POA: Insufficient documentation

## 2013-06-25 DIAGNOSIS — O99810 Abnormal glucose complicating pregnancy: Secondary | ICD-10-CM | POA: Insufficient documentation

## 2013-06-25 DIAGNOSIS — O3660X0 Maternal care for excessive fetal growth, unspecified trimester, not applicable or unspecified: Secondary | ICD-10-CM | POA: Insufficient documentation

## 2013-06-28 ENCOUNTER — Other Ambulatory Visit (HOSPITAL_COMMUNITY): Payer: Self-pay | Admitting: Maternal and Fetal Medicine

## 2013-06-28 ENCOUNTER — Ambulatory Visit (INDEPENDENT_AMBULATORY_CARE_PROVIDER_SITE_OTHER): Payer: Managed Care, Other (non HMO) | Admitting: Advanced Practice Midwife

## 2013-06-28 VITALS — BP 106/70 | Wt 284.0 lb

## 2013-06-28 DIAGNOSIS — O24419 Gestational diabetes mellitus in pregnancy, unspecified control: Secondary | ICD-10-CM

## 2013-06-28 DIAGNOSIS — O99213 Obesity complicating pregnancy, third trimester: Secondary | ICD-10-CM

## 2013-06-28 DIAGNOSIS — O99810 Abnormal glucose complicating pregnancy: Secondary | ICD-10-CM

## 2013-06-28 DIAGNOSIS — O10913 Unspecified pre-existing hypertension complicating pregnancy, third trimester: Secondary | ICD-10-CM

## 2013-06-28 DIAGNOSIS — O24919 Unspecified diabetes mellitus in pregnancy, unspecified trimester: Secondary | ICD-10-CM

## 2013-06-28 MED ORDER — GLYBURIDE 2.5 MG PO TABS: 5.0000 mg | ORAL_TABLET | Freq: Two times a day (BID) | ORAL | Status: DC

## 2013-06-28 MED ORDER — GLYBURIDE 5 MG PO TABS: 5.0000 mg | ORAL_TABLET | Freq: Two times a day (BID) | ORAL | Status: DC

## 2013-06-28 NOTE — Progress Notes (Signed)
p=95 

## 2013-06-28 NOTE — Progress Notes (Signed)
Doing well.  Good fetal movement, denies vaginal bleeding, LOF, regular contractions.  Reviewed glucose log.  Fastings 77-126, mostly 80s; PP after breakfast and lunch 90s-120s; PP after dinner 120s-160s, with several 140s, 150s.  Discussed diet, pt eating larger meal at dinner.  Pt eating rice, drinking juice throughout day.  Provided education about diabetic diet, recommend smaller portion/less carbohydrates.    Consult Dr Erin Fulling.  Increase Glyburide to 5 mg BID.  F/U next visit.

## 2013-07-02 ENCOUNTER — Ambulatory Visit (HOSPITAL_COMMUNITY)
Admission: RE | Admit: 2013-07-02 | Discharge: 2013-07-02 | Disposition: A | Discharge status: Home / Self Care | Payer: Managed Care, Other (non HMO) | Source: Ambulatory Visit | Attending: Family | Admitting: Family

## 2013-07-02 ENCOUNTER — Other Ambulatory Visit (HOSPITAL_COMMUNITY): Payer: Self-pay | Admitting: Maternal and Fetal Medicine

## 2013-07-02 ENCOUNTER — Ambulatory Visit (HOSPITAL_COMMUNITY)
Admission: RE | Admit: 2013-07-02 | Discharge: 2013-07-02 | Disposition: A | Discharge status: Home / Self Care | Payer: Managed Care, Other (non HMO) | Source: Ambulatory Visit | Attending: Obstetrics and Gynecology | Admitting: Obstetrics and Gynecology

## 2013-07-02 DIAGNOSIS — O24419 Gestational diabetes mellitus in pregnancy, unspecified control: Secondary | ICD-10-CM

## 2013-07-02 DIAGNOSIS — O10913 Unspecified pre-existing hypertension complicating pregnancy, third trimester: Secondary | ICD-10-CM

## 2013-07-02 DIAGNOSIS — E669 Obesity, unspecified: Secondary | ICD-10-CM | POA: Insufficient documentation

## 2013-07-02 DIAGNOSIS — O34219 Maternal care for unspecified type scar from previous cesarean delivery: Secondary | ICD-10-CM | POA: Insufficient documentation

## 2013-07-02 DIAGNOSIS — O99213 Obesity complicating pregnancy, third trimester: Secondary | ICD-10-CM

## 2013-07-02 DIAGNOSIS — O99810 Abnormal glucose complicating pregnancy: Secondary | ICD-10-CM | POA: Insufficient documentation

## 2013-07-02 DIAGNOSIS — O3660X0 Maternal care for excessive fetal growth, unspecified trimester, not applicable or unspecified: Secondary | ICD-10-CM | POA: Insufficient documentation

## 2013-07-05 ENCOUNTER — Ambulatory Visit (INDEPENDENT_AMBULATORY_CARE_PROVIDER_SITE_OTHER): Payer: Managed Care, Other (non HMO) | Admitting: Family

## 2013-07-05 VITALS — BP 115/54 | Wt 285.0 lb

## 2013-07-05 DIAGNOSIS — Z3480 Encounter for supervision of other normal pregnancy, unspecified trimester: Secondary | ICD-10-CM

## 2013-07-05 DIAGNOSIS — O99810 Abnormal glucose complicating pregnancy: Secondary | ICD-10-CM

## 2013-07-05 DIAGNOSIS — O24419 Gestational diabetes mellitus in pregnancy, unspecified control: Secondary | ICD-10-CM

## 2013-07-05 LAB — OB RESULTS CONSOLE GBS: GBS: NEGATIVE

## 2013-07-05 NOTE — Progress Notes (Signed)
P - 114 

## 2013-07-05 NOTE — Progress Notes (Signed)
FBS 68-120 (4/9 abnl) Bkfst 68-127 (2/9 abnl) Lunch 73-158 (3/9 abnl) Dinner 89-156 (3/9 abnl).  Reviewed [place] results BPP 8/8; last growth [place] 9/30 89%.  Obtained GBS/GC/Ct today.  BPP Tuesday and appt/nst Friday.

## 2013-07-06 LAB — GC/CHLAMYDIA PROBE AMP
CT Probe RNA: NEGATIVE
GC Probe RNA: NEGATIVE

## 2013-07-09 ENCOUNTER — Ambulatory Visit (HOSPITAL_COMMUNITY)
Admission: RE | Admit: 2013-07-09 | Discharge: 2013-07-09 | Disposition: A | Discharge status: Home / Self Care | Payer: Managed Care, Other (non HMO) | Source: Ambulatory Visit | Attending: Obstetrics & Gynecology | Admitting: Obstetrics & Gynecology

## 2013-07-09 ENCOUNTER — Other Ambulatory Visit (HOSPITAL_COMMUNITY): Payer: Self-pay | Admitting: Maternal and Fetal Medicine

## 2013-07-09 ENCOUNTER — Ambulatory Visit (HOSPITAL_COMMUNITY): Admission: RE | Admit: 2013-07-09 | Payer: Managed Care, Other (non HMO) | Source: Ambulatory Visit

## 2013-07-09 VITALS — BP 109/56 | HR 93 | Wt 284.0 lb

## 2013-07-09 DIAGNOSIS — O24419 Gestational diabetes mellitus in pregnancy, unspecified control: Secondary | ICD-10-CM

## 2013-07-09 DIAGNOSIS — O34219 Maternal care for unspecified type scar from previous cesarean delivery: Secondary | ICD-10-CM | POA: Insufficient documentation

## 2013-07-09 DIAGNOSIS — O10913 Unspecified pre-existing hypertension complicating pregnancy, third trimester: Secondary | ICD-10-CM

## 2013-07-09 DIAGNOSIS — E669 Obesity, unspecified: Secondary | ICD-10-CM | POA: Insufficient documentation

## 2013-07-09 DIAGNOSIS — O99213 Obesity complicating pregnancy, third trimester: Secondary | ICD-10-CM

## 2013-07-09 DIAGNOSIS — Z3483 Encounter for supervision of other normal pregnancy, third trimester: Secondary | ICD-10-CM

## 2013-07-09 DIAGNOSIS — O3660X0 Maternal care for excessive fetal growth, unspecified trimester, not applicable or unspecified: Secondary | ICD-10-CM | POA: Insufficient documentation

## 2013-07-09 DIAGNOSIS — O99810 Abnormal glucose complicating pregnancy: Secondary | ICD-10-CM | POA: Insufficient documentation

## 2013-07-12 ENCOUNTER — Other Ambulatory Visit: Payer: Self-pay | Admitting: Obstetrics & Gynecology

## 2013-07-12 ENCOUNTER — Ambulatory Visit (INDEPENDENT_AMBULATORY_CARE_PROVIDER_SITE_OTHER): Payer: Managed Care, Other (non HMO) | Admitting: Advanced Practice Midwife

## 2013-07-12 VITALS — BP 118/51 | Wt 284.0 lb

## 2013-07-12 DIAGNOSIS — Z3480 Encounter for supervision of other normal pregnancy, unspecified trimester: Secondary | ICD-10-CM

## 2013-07-12 DIAGNOSIS — O24419 Gestational diabetes mellitus in pregnancy, unspecified control: Secondary | ICD-10-CM

## 2013-07-12 DIAGNOSIS — O99810 Abnormal glucose complicating pregnancy: Secondary | ICD-10-CM

## 2013-07-12 DIAGNOSIS — O368190 Decreased fetal movements, unspecified trimester, not applicable or unspecified: Secondary | ICD-10-CM

## 2013-07-12 LAB — POCT URINALYSIS DIPSTICK
Bilirubin, UA: NEGATIVE
Glucose, UA: NEGATIVE
Leukocytes, UA: NEGATIVE
Nitrite, UA: NEGATIVE
Urobilinogen, UA: 0.2

## 2013-07-12 NOTE — Progress Notes (Signed)
P - 98 - NST today - Pt states she has not felt movement today as she normally does

## 2013-07-12 NOTE — Progress Notes (Signed)
Doing well.  Good fetal movement since arrival in office, denies vaginal bleeding, LOF, regular contractions.  Reviewed glucose log.  F: 78-99; PP 64-120 with one outlier in 140s. Improved since last visit, pt taking Glyburide 5 mg BID as prescribed.  Biweekly testing continues next week.  Reviewed signs of labor, warning signs of pregnancy, reasons to come to hospital.  IOL scheduled for 39 weeks.

## 2013-07-12 NOTE — Addendum Note (Signed)
Addended by: Arne Cleveland on: 07/12/2013 12:11 PM   Modules accepted: Orders

## 2013-07-14 ENCOUNTER — Encounter: Payer: Self-pay | Admitting: Family

## 2013-07-14 LAB — CULTURE, OB URINE

## 2013-07-15 ENCOUNTER — Telehealth: Payer: Self-pay | Admitting: *Deleted

## 2013-07-15 DIAGNOSIS — N390 Urinary tract infection, site not specified: Secondary | ICD-10-CM

## 2013-07-15 MED ORDER — NITROFURANTOIN MONOHYD MACRO 100 MG PO CAPS: 100.0000 mg | ORAL_CAPSULE | Freq: Two times a day (BID) | ORAL | Status: DC

## 2013-07-15 NOTE — Telephone Encounter (Signed)
Called pt to adv UTI and will send script for Macrobid to pharm [facility] for pt to pick up meds and begin and call back if any questions

## 2013-07-16 ENCOUNTER — Ambulatory Visit (HOSPITAL_COMMUNITY)
Admission: RE | Admit: 2013-07-16 | Discharge: 2013-07-16 | Disposition: A | Discharge status: Home / Self Care | Payer: Managed Care, Other (non HMO) | Source: Ambulatory Visit | Attending: Obstetrics & Gynecology | Admitting: Obstetrics & Gynecology

## 2013-07-16 ENCOUNTER — Other Ambulatory Visit: Payer: Self-pay | Admitting: Obstetrics & Gynecology

## 2013-07-16 VITALS — BP 122/66 | HR 94 | Wt 286.5 lb

## 2013-07-16 DIAGNOSIS — O34219 Maternal care for unspecified type scar from previous cesarean delivery: Secondary | ICD-10-CM | POA: Insufficient documentation

## 2013-07-16 DIAGNOSIS — O24419 Gestational diabetes mellitus in pregnancy, unspecified control: Secondary | ICD-10-CM

## 2013-07-16 DIAGNOSIS — O99810 Abnormal glucose complicating pregnancy: Secondary | ICD-10-CM | POA: Insufficient documentation

## 2013-07-16 DIAGNOSIS — E669 Obesity, unspecified: Secondary | ICD-10-CM | POA: Insufficient documentation

## 2013-07-16 DIAGNOSIS — O3660X0 Maternal care for excessive fetal growth, unspecified trimester, not applicable or unspecified: Secondary | ICD-10-CM | POA: Insufficient documentation

## 2013-07-16 NOTE — Progress Notes (Addendum)
[name]  was seen today for an ultrasound appointment.  See full report in [facility].  Impression: Single IUP at 37 5/7 weeks A2 GDM on glyburide Estimated fetal weight today is > 90th %tile (3695 g), the AC measures > 97th %tile Active fetus with BPP of 8/8 Normal amniotic fluid volume  Recommendations: Recommend 2x weekly NSTs with weekly AFIs Delivery at 39 weeks due to A2 GDM.  Alpha Gula, MD

## 2013-07-17 ENCOUNTER — Encounter (HOSPITAL_COMMUNITY): Payer: Self-pay | Admitting: *Deleted

## 2013-07-17 ENCOUNTER — Telehealth (HOSPITAL_COMMUNITY): Payer: Self-pay | Admitting: *Deleted

## 2013-07-17 NOTE — Telephone Encounter (Signed)
Preadmission screen  

## 2013-07-19 ENCOUNTER — Ambulatory Visit (INDEPENDENT_AMBULATORY_CARE_PROVIDER_SITE_OTHER): Payer: Managed Care, Other (non HMO) | Admitting: Family

## 2013-07-19 VITALS — BP 130/66 | Wt 284.0 lb

## 2013-07-19 DIAGNOSIS — O3660X0 Maternal care for excessive fetal growth, unspecified trimester, not applicable or unspecified: Secondary | ICD-10-CM

## 2013-07-19 DIAGNOSIS — Z3480 Encounter for supervision of other normal pregnancy, unspecified trimester: Secondary | ICD-10-CM

## 2013-07-19 DIAGNOSIS — R300 Dysuria: Secondary | ICD-10-CM

## 2013-07-19 DIAGNOSIS — O99810 Abnormal glucose complicating pregnancy: Secondary | ICD-10-CM

## 2013-07-19 DIAGNOSIS — O3663X1 Maternal care for excessive fetal growth, third trimester, fetus 1: Secondary | ICD-10-CM

## 2013-07-19 DIAGNOSIS — O24419 Gestational diabetes mellitus in pregnancy, unspecified control: Secondary | ICD-10-CM

## 2013-07-19 LAB — POCT URINALYSIS DIPSTICK
Glucose, UA: NEGATIVE
Nitrite, UA: NEGATIVE
Spec Grav, UA: 1.02
Urobilinogen, UA: 0.2

## 2013-07-19 NOTE — Progress Notes (Signed)
P - 88 - Pt states she has had some brown vaginal discharge - strong irregular contractions - a lot of vaginal pressure - Urine dip shows mod ketones

## 2013-07-19 NOTE — Progress Notes (Signed)
FBS 70-87 Bkst 64-107 Lunch 66-135 (1/7 abnl); Dinner [phone] (1/7 abnl); BPP for Tuesday at hospital; IOL scheduled for 07/25/13 at 0730.

## 2013-07-21 ENCOUNTER — Inpatient Hospital Stay (HOSPITAL_COMMUNITY)
Admission: AD | Admit: 2013-07-21 | Discharge: 2013-07-21 | Disposition: A | Discharge status: Home / Self Care | Payer: Managed Care, Other (non HMO) | Source: Ambulatory Visit | Attending: Obstetrics & Gynecology | Admitting: Obstetrics & Gynecology

## 2013-07-21 ENCOUNTER — Encounter (HOSPITAL_COMMUNITY): Payer: Self-pay | Admitting: *Deleted

## 2013-07-21 ENCOUNTER — Inpatient Hospital Stay (HOSPITAL_COMMUNITY): Payer: Managed Care, Other (non HMO)

## 2013-07-21 DIAGNOSIS — O368131 Decreased fetal movements, third trimester, fetus 1: Secondary | ICD-10-CM

## 2013-07-21 DIAGNOSIS — O368190 Decreased fetal movements, unspecified trimester, not applicable or unspecified: Secondary | ICD-10-CM | POA: Insufficient documentation

## 2013-07-21 NOTE — MAU Provider Note (Signed)
Chief Complaint:  Decreased Fetal Movement   [name] is a 32 y.o.  G2P1001 with IUP at [redacted]w[redacted]d presenting for Decreased Fetal Movement   Pt states she has not felt her baby move for 10 hours and that is why she decided to come in.  She has felt him move once since getting here but still not normal movement. . She denies ctx, vb, LOF.   PNC at [facility] and complicated by A2DM, hx of c/s and desiring tolac, LGA baby.    Menstrual History: OB History   Grav Para Term Preterm Abortions TAB SAB Ect Mult Living   2 1 1       1       G1- c/s for fetal distress at 6cm Patient's last menstrual period was 09/26/2012.      Past Medical History  Diagnosis Date  . Allergy   . Anxiety   . Depression   . Obesity   . PONV (postoperative nausea and vomiting)   . Diabetes mellitus without complication   . Hernia   . Gestational diabetes     glyburide  . Hypertension   . Deafness in right ear     Past Surgical History  Procedure Laterality Date  . Cesarean section      Family History  Problem Relation Age of Onset  . Cancer Maternal Grandmother     colon  . Dementia Maternal Grandfather   . Hypothyroidism Mother   . Hypertension Mother     History  Substance Use Topics  . Smoking status: Never Smoker   . Smokeless tobacco: Never Used  . Alcohol Use: No     Comment: socially     No Known Allergies  Prescriptions prior to admission  Medication Sig Dispense Refill  . ACCU-CHEK FASTCLIX LANCETS MISC 1 Device by Percutaneous route 4 (four) times daily.  100 each  12  . albuterol (PROVENTIL HFA;VENTOLIN HFA) 108 (90 BASE) MCG/ACT inhaler Inhale 2 puffs into the lungs every 6 (six) hours as needed for wheezing.      Marland Kitchen Cod Liver Oil 10 MINIM CAPS Take 1 capsule by mouth daily.       Marland Kitchen docusate sodium (COLACE) 100 MG capsule Take 100 mg by mouth 2 (two) times daily.      Marland Kitchen glucose blood test strip Use as instructed  100 each  12  . glyBURIDE (DIABETA) 2.5 MG tablet  Take 2 tablets (5 mg total) by mouth 2 (two) times daily with a meal.  120 tablet  2  . glyBURIDE (DIABETA) 5 MG tablet Take 1 tablet (5 mg total) by mouth 2 (two) times daily with a meal.  60 tablet  2  . nitrofurantoin, macrocrystal-monohydrate, (MACROBID) 100 MG capsule Take 1 capsule (100 mg total) by mouth 2 (two) times daily.  14 capsule  1  . OVER THE COUNTER MEDICATION Take 4 capsules by mouth daily. Juice Plus vegetable and fruit supplement.      Marland Kitchen oxyCODONE-acetaminophen (PERCOCET) 5-325 MG per tablet Take 1 tablet by mouth every 6 (six) hours as needed for pain.  30 tablet  0  . polyethylene glycol powder (GLYCOLAX/MIRALAX) powder Take 17 g by mouth daily.  255 g  3  . Prenatal Vit-Fe Fumarate-FA (PRENATAL MULTIVITAMIN) TABS Take 1 tablet by mouth daily at 12 noon.      . promethazine (PHENERGAN) 25 MG tablet Take 25 mg by mouth every 6 (six) hours as needed for nausea.  Review of Systems - Negative except for what is mentioned in HPI.  Physical Exam  Blood pressure 122/68, pulse 97, temperature 98 F (36.7 C), temperature source Oral, resp. rate 18, height 5\' 7"  (1.702 m), weight 130.636 kg (288 lb), last menstrual period 09/26/2012. GENERAL: Well-developed, well-nourished female in no acute distress.  LUNGS: Clear to auscultation bilaterally.  HEART: Regular rate and rhythm. ABDOMEN: Soft, nontender, nondistended, gravid.  EXTREMITIES: Nontender, no edema, 2+ distal pulses. FHT:  Baseline rate 150 bpm   Variability moderate  Accelerations present   Decelerations none Contractions: quiet   Labs: No results found for this or any previous visit (from the past 24 hour(s)).  Imaging Studies:    Assessment: [name] is  32 y.o. G2P1001 at [redacted]w[redacted]d presents with Decreased Fetal Movement .  Plan: 1) decreased FM NST beautifully reactive and category I but still not subjectively improved so BPP was done in the setting of high risk pregnancy.  bpp 8/8 and AFI  normal Reassurance given Kick counts discussed F/u as scheduled with MFM on Tuesday and here on Thursday for IOL @ 39 weeks.    Jesstin Studstill L 10/26/201410:03 AM

## 2013-07-21 NOTE — MAU Note (Signed)
No movement X 10 hours. States she drank fluids, ate, moved around and still did not feel movement. States baby usually moves all day.

## 2013-07-22 ENCOUNTER — Other Ambulatory Visit: Payer: Self-pay | Admitting: Obstetrics & Gynecology

## 2013-07-22 DIAGNOSIS — O24419 Gestational diabetes mellitus in pregnancy, unspecified control: Secondary | ICD-10-CM

## 2013-07-23 ENCOUNTER — Ambulatory Visit (HOSPITAL_COMMUNITY)
Admission: RE | Admit: 2013-07-23 | Discharge: 2013-07-23 | Disposition: A | Discharge status: Home / Self Care | Payer: Managed Care, Other (non HMO) | Source: Ambulatory Visit | Attending: Obstetrics & Gynecology | Admitting: Obstetrics & Gynecology

## 2013-07-23 ENCOUNTER — Other Ambulatory Visit: Payer: Self-pay | Admitting: Obstetrics & Gynecology

## 2013-07-23 VITALS — BP 109/57 | HR 97 | Wt 289.0 lb

## 2013-07-23 DIAGNOSIS — O34219 Maternal care for unspecified type scar from previous cesarean delivery: Secondary | ICD-10-CM | POA: Insufficient documentation

## 2013-07-23 DIAGNOSIS — O99810 Abnormal glucose complicating pregnancy: Secondary | ICD-10-CM | POA: Insufficient documentation

## 2013-07-23 DIAGNOSIS — O24419 Gestational diabetes mellitus in pregnancy, unspecified control: Secondary | ICD-10-CM

## 2013-07-23 DIAGNOSIS — O368190 Decreased fetal movements, unspecified trimester, not applicable or unspecified: Secondary | ICD-10-CM | POA: Insufficient documentation

## 2013-07-23 DIAGNOSIS — E669 Obesity, unspecified: Secondary | ICD-10-CM | POA: Insufficient documentation

## 2013-07-23 DIAGNOSIS — O3660X0 Maternal care for excessive fetal growth, unspecified trimester, not applicable or unspecified: Secondary | ICD-10-CM | POA: Insufficient documentation

## 2013-07-23 NOTE — Progress Notes (Signed)
[name]  was seen today for an ultrasound appointment.  See full report in [facility].  Impression: Single IUP at 38 5/7 weeks A2 GDM on Glyburide Active fetus with BPP of 8/8 Normal amniotic fluid volume  Recommendations: Patient tentatively scheduled for induction of labor later this week. Follow-up ultrasounds as clinically indicated.   Alpha Gula, MD

## 2013-07-25 ENCOUNTER — Inpatient Hospital Stay (HOSPITAL_COMMUNITY)
Admission: RE | Admit: 2013-07-25 | Discharge: 2013-07-28 | DRG: 774 | Disposition: A | Discharge status: Home / Self Care | Payer: Managed Care, Other (non HMO) | Source: Ambulatory Visit | Attending: Obstetrics & Gynecology | Admitting: Obstetrics & Gynecology

## 2013-07-25 ENCOUNTER — Encounter (HOSPITAL_COMMUNITY): Payer: Self-pay

## 2013-07-25 VITALS — BP 127/77 | HR 83 | Temp 98.6°F | Resp 16 | Ht 67.0 in | Wt 289.0 lb

## 2013-07-25 DIAGNOSIS — Z3483 Encounter for supervision of other normal pregnancy, third trimester: Secondary | ICD-10-CM

## 2013-07-25 DIAGNOSIS — O1002 Pre-existing essential hypertension complicating childbirth: Principal | ICD-10-CM | POA: Diagnosis present

## 2013-07-25 DIAGNOSIS — O10919 Unspecified pre-existing hypertension complicating pregnancy, unspecified trimester: Secondary | ICD-10-CM

## 2013-07-25 DIAGNOSIS — Z3480 Encounter for supervision of other normal pregnancy, unspecified trimester: Secondary | ICD-10-CM

## 2013-07-25 DIAGNOSIS — O99814 Abnormal glucose complicating childbirth: Secondary | ICD-10-CM | POA: Diagnosis present

## 2013-07-25 DIAGNOSIS — O34219 Maternal care for unspecified type scar from previous cesarean delivery: Secondary | ICD-10-CM | POA: Diagnosis present

## 2013-07-25 DIAGNOSIS — O24419 Gestational diabetes mellitus in pregnancy, unspecified control: Secondary | ICD-10-CM

## 2013-07-25 LAB — COMPREHENSIVE METABOLIC PANEL
ALT: 9 U/L (ref 0–35)
Albumin: 2.7 g/dL — ABNORMAL LOW (ref 3.5–5.2)
Alkaline Phosphatase: 140 U/L — ABNORMAL HIGH (ref 39–117)
Calcium: 9.8 mg/dL (ref 8.4–10.5)
Creatinine, Ser: 0.59 mg/dL (ref 0.50–1.10)
GFR calc Af Amer: >90 mL/min (ref >90)
Glucose, Bld: 91 mg/dL (ref 70–99)
Potassium: 3.9 mEq/L (ref 3.5–5.1)
Sodium: 137 mEq/L (ref 135–145)
Total Protein: 6.4 g/dL (ref 6.0–8.3)

## 2013-07-25 LAB — GLUCOSE, CAPILLARY
Glucose-Capillary: 85 mg/dL (ref 70–99)
Glucose-Capillary: 89 mg/dL (ref 70–99)

## 2013-07-25 LAB — GLUCOSE, RANDOM: Glucose, Bld: 56 mg/dL — ABNORMAL LOW (ref 70–99)

## 2013-07-25 LAB — CBC
MCHC: 34.1 g/dL (ref 30.0–36.0)
Platelets: 226 10*3/uL (ref 150–400)
RDW: 14.1 % (ref 11.5–15.5)

## 2013-07-25 LAB — ABO/RH: ABO/RH(D): A POS

## 2013-07-25 LAB — TYPE AND SCREEN: ABO/RH(D): A POS

## 2013-07-25 LAB — RPR: RPR Ser Ql: NONREACTIVE

## 2013-07-25 LAB — PROTEIN / CREATININE RATIO, URINE: Protein Creatinine Ratio: 0.15 (ref 0.00–0.15)

## 2013-07-25 MED ORDER — LACTATED RINGERS IV SOLN
INTRAVENOUS | Status: DC
  Administered 2013-07-25 – 2013-07-26 (×5): via INTRAVENOUS

## 2013-07-25 MED ORDER — LIDOCAINE HCL (PF) 1 % IJ SOLN
30.0000 mL | INTRAMUSCULAR | Status: AC | PRN
  Administered 2013-07-26: 30 mL via SUBCUTANEOUS
  Filled 2013-07-25 (×2): qty 30

## 2013-07-25 MED ORDER — TERBUTALINE SULFATE 1 MG/ML IJ SOLN: 0.2500 mg | Freq: Once | INTRAMUSCULAR | Status: AC | PRN

## 2013-07-25 MED ORDER — OXYCODONE-ACETAMINOPHEN 5-325 MG PO TABS: 1.0000 | ORAL_TABLET | ORAL | Status: DC | PRN

## 2013-07-25 MED ORDER — OXYTOCIN 40 UNITS IN LACTATED RINGERS INFUSION - SIMPLE MED
62.5000 mL/h | INTRAVENOUS | Status: DC
  Administered 2013-07-26: 62.5 mL/h via INTRAVENOUS

## 2013-07-25 MED ORDER — LACTATED RINGERS IV SOLN
500.0000 mL | INTRAVENOUS | Status: DC | PRN
  Administered 2013-07-26: 1000 mL via INTRAVENOUS

## 2013-07-25 MED ORDER — OXYTOCIN BOLUS FROM INFUSION: 500.0000 mL | INTRAVENOUS | Status: DC

## 2013-07-25 MED ORDER — IBUPROFEN 600 MG PO TABS
600.0000 mg | ORAL_TABLET | Freq: Four times a day (QID) | ORAL | Status: DC | PRN
  Administered 2013-07-26: 600 mg via ORAL
  Filled 2013-07-25: qty 1

## 2013-07-25 MED ORDER — OXYTOCIN 40 UNITS IN LACTATED RINGERS INFUSION - SIMPLE MED
1.0000 m[IU]/min | INTRAVENOUS | Status: DC
  Administered 2013-07-25: 2 m[IU]/min via INTRAVENOUS
  Filled 2013-07-25: qty 1000

## 2013-07-25 MED ORDER — ACETAMINOPHEN 325 MG PO TABS
650.0000 mg | ORAL_TABLET | ORAL | Status: DC | PRN
  Administered 2013-07-26: 650 mg via ORAL
  Filled 2013-07-25: qty 2

## 2013-07-25 MED ORDER — CITRIC ACID-SODIUM CITRATE 334-500 MG/5ML PO SOLN: 30.0000 mL | ORAL | Status: DC | PRN

## 2013-07-25 MED ORDER — ONDANSETRON HCL 4 MG/2ML IJ SOLN
4.0000 mg | Freq: Four times a day (QID) | INTRAMUSCULAR | Status: DC | PRN
  Administered 2013-07-26: 4 mg via INTRAVENOUS
  Filled 2013-07-25: qty 2

## 2013-07-25 NOTE — H&P (Signed)
Attestation of Attending Supervision of Obstetric Fellow: Evaluation and management procedures were performed by the Obstetric Fellow under my supervision and collaboration.  I have reviewed the Obstetric Fellow's note and chart, and I agree with the management and plan.  Marchele Decock, MD, FACOG Attending Obstetrician & Gynecologist Faculty Practice, [facility]   

## 2013-07-25 NOTE — Progress Notes (Addendum)
[name] is a 32 y.o. G2P1001 at [redacted]w[redacted]d admitted for A2gDM and cHTN Subjective: Sitting up comfortably in chair. S/p ambulation and foley bulb falling out Eating dinner, normal range CBGs 80s  Objective: BP 134/66  Pulse 95  Temp(Src) 98.1 F (36.7 C) (Oral)  Resp 20  Ht 5\' 7"  (1.702 m)  Wt 131.09 kg (289 lb)  BMI 45.25 kg/m2  LMP 09/26/2012      FHT:  FHR: 140 bpm, variability: moderate,  accelerations:  Present,  decelerations:  Absent UC:   none SVE:   Dilation: 5 Effacement (%): 60 Station: -3 Exam by:: Dr. Michail Jewels  Labs: Lab Results  Component Value Date   WBC 15.3* 07/25/2013   HGB 11.6* 07/25/2013   HCT 34.0* 07/25/2013   MCV 88.8 07/25/2013   PLT 226 07/25/2013    Assessment / Plan: Induction of labor due to gestational diabetes,  cHTN  Labor: Progressing normally, s/p foley out, starting pit 2X2 Preeclampsia:  no signs or symptoms of toxicity Fetal Wellbeing:  Category I Pain Control:  planning for epidural I/D:  n/a Anticipated MOD:  NSVD  [name] 07/25/2013, 7:47 PM

## 2013-07-25 NOTE — H&P (Signed)
[name] is a 32 y.o. female presenting for IOL @[redacted]w[redacted]d   2/2 A2DM and cHTN. No current complaints. Has noticed occasionally when she gets up in the am will see dark spots before her eyes from approx 5 mins but is self resolved. Denies headache, n/v, epigastric pain, RUQ pain. Did initially have some n/v in early 2nd trimester but seems to have resolved. Denies SOB, chest palps or pain. Does currently have umbilical hernia but asymptomatic. No LOF, vaginal bleeding. Still with active fetal movement. Has intermittently experienced abdominal cramping for the past month described as suprapubic pressure lasting anywhere from 30sec-50min.   PNC at [facility] since 1st trimester. Hx of LTCS for NRFHR. Desires TOLAC. Nml genetic screen. Nml anatomic [place] at 19weeks. Abnl glucose. On glyburide. Chronic HTN initially with early 24hr urine protein baseline 88. Growth at 31weeks at 67% but f/up at 38weeks >90% percentile. AC >97%. GBS neg. Plans to breast feed  Maternal Medical History:  Reason for admission: Nausea.    OB History   Grav Para Term Preterm Abortions TAB SAB Ect Mult Living   2 1 1  0 0 0 0 0 0 1     1. Term, LTCS 39w1, 8lb8oz M 2. Current  Past Medical History  Diagnosis Date  . Allergy   . Anxiety   . Depression   . Obesity   . Diabetes mellitus without complication   . Hernia   . Gestational diabetes     glyburide  . Hypertension   . Deafness in right ear   . PONV (postoperative nausea and vomiting)    Past Surgical History  Procedure Laterality Date  . Cesarean section     Family History: family history includes Cancer in her maternal grandmother; Dementia in her maternal grandfather; Hypertension in her mother; Hypothyroidism in her mother. Social History:  reports that she has never smoked. She has never used smokeless tobacco. She reports that she does not drink alcohol or use illicit drugs.   Prenatal Transfer Tool  Maternal Diabetes: Yes:  Diabetes Type:   Insulin/Medication controlled Genetic Screening: Normal Maternal Ultrasounds/Referrals: Normal, LGA >90% Fetal Ultrasounds or other Referrals:  None Maternal Substance Abuse:  No Significant Maternal Medications:  Meds include: Other: glyburide, albuterol, miralax Significant Maternal Lab Results:  Lab values include: Group B Strep negative Other Comments:  None  Review of Systems  Constitutional: Negative for fever and malaise/fatigue.  HENT: Negative for congestion and sore throat.   Eyes: Positive for blurred vision. Negative for double vision and pain.  Respiratory: Negative for cough, sputum production, shortness of breath and wheezing.   Cardiovascular: Positive for leg swelling. Negative for chest pain and palpitations.  Gastrointestinal: Positive for heartburn and abdominal pain. Negative for nausea, vomiting, diarrhea and constipation.  Genitourinary: Negative for dysuria, urgency and frequency.  Musculoskeletal: Negative for myalgias.  Neurological: Negative for dizziness, seizures, loss of consciousness, weakness and headaches.  Endo/Heme/Allergies: Does not bruise/bleed easily.  Psychiatric/Behavioral: Negative for depression.      Blood pressure 123/57, pulse 106, temperature 98.1 F (36.7 C), temperature source Oral, resp. rate 20, height 5\' 7"  (1.702 m), weight 131.09 kg (289 lb), last menstrual period 09/26/2012. Exam Physical Exam  Constitutional: She is oriented to person, place, and time. She appears well-developed and well-nourished. No distress.  HENT:  Head: Normocephalic and atraumatic.  Eyes: EOM are normal.  Neck: Neck supple.  Cardiovascular: Normal rate and regular rhythm.   No murmur heard. Respiratory: Effort normal and breath sounds normal.  No respiratory distress. She has no wheezes.  GI: Soft. Bowel sounds are normal. There is no tenderness.  Gravid; Umbilical hernia, reducible   Musculoskeletal: Normal range of motion. She exhibits no edema.   Neurological: She is alert and oriented to person, place, and time. She has normal reflexes. She exhibits normal muscle tone.  Skin: Skin is warm and dry. No erythema.  Psychiatric: She has a normal mood and affect. Her behavior is normal.     Dilation: 1 Effacement (%): 30 Cervical Position: Posterior Station: -3 Presentation: Vertex Exam by:: Dr. Reola Calkins  Prenatal labs: ABO, Rh: A/POS/-- (04/03 1614) Antibody: NEG (04/03 1614) Rubella: 7.30 (04/03 1614) RPR: NON REAC (04/03 1614)  HBsAg: NEGATIVE (04/03 1614)  HIV: NON REACTIVE (04/03 1614)  GBS: Negative (10/10 0000)   FHR- baseline 155, mod variability, post accel (10x10), no decel UC- every 5-10 mins  Assessment/Plan: Pt is a 32 y.o G2P1001 who presents at 39w0 for IOL 2/2 cHTN and A2DM  1. IOL- s/p foley bulb placement. TOLAC. First baby delivered via CS 2/2 NRFHT. Pt understands risks/benefits of vaginal birth.  -routine orders -HIV NR, GBS neg -pain control with epidural when in active labor, fentanyl prn -at this time expect NSVD  2. A2GDM- has been on glyburide 5 for the latter half of pregnancy, compliant with this medication 100% per pt. CBGs 80s-90s. Initially hypoglycemic here (56) but had not yet eaten this morning -plan for CBGs q4 and then q2 when in active labor -will hold medication for now  3. cHTN- denies signs/sx of pre-e. Bps normotensive range here.  -will continue to monitor closely -no indication for medication at this time  4. Asthma- no active flares, lungs CTAB, no wheezing appreciated -albuterol prn  5. FHR- reassuring -cat I tracing  6. Postpartum- plans to breast feed, desires nexplanon for contraception  Anselm Lis 07/25/2013, 9:43 AM  I have seen and examined this patient and agree with above documentation in the resident's note.  IOL for A2DM and cHTN. Pt is a previous c/s and desires tolac. EFW>90th%ile. Will avoid cervical ripening with cytotec given hx of C/S. Foley bulb  placed without difficulty. Allow to ripen on her own and then plan on low dose pit.    Rulon Abide, M.D. [facility] 07/25/2013 11:17 AM

## 2013-07-26 ENCOUNTER — Encounter (HOSPITAL_COMMUNITY): Payer: Managed Care, Other (non HMO) | Admitting: Anesthesiology

## 2013-07-26 ENCOUNTER — Encounter (HOSPITAL_COMMUNITY): Payer: Self-pay

## 2013-07-26 ENCOUNTER — Inpatient Hospital Stay (HOSPITAL_COMMUNITY): Payer: Managed Care, Other (non HMO) | Admitting: Anesthesiology

## 2013-07-26 DIAGNOSIS — O99814 Abnormal glucose complicating childbirth: Secondary | ICD-10-CM

## 2013-07-26 DIAGNOSIS — O1002 Pre-existing essential hypertension complicating childbirth: Secondary | ICD-10-CM

## 2013-07-26 LAB — CBC
HCT: 33.7 % — ABNORMAL LOW (ref 36.0–46.0)
HCT: 34.7 % — ABNORMAL LOW (ref 36.0–46.0)
Hemoglobin: 11.9 g/dL — ABNORMAL LOW (ref 12.0–15.0)
MCH: 30 pg (ref 26.0–34.0)
MCH: 30.6 pg (ref 26.0–34.0)
MCHC: 33.8 g/dL (ref 30.0–36.0)
MCV: 89.2 fL (ref 78.0–100.0)
RBC: 3.8 MIL/uL — ABNORMAL LOW (ref 3.87–5.11)
RBC: 3.89 MIL/uL (ref 3.87–5.11)
RDW: 14.1 % (ref 11.5–15.5)
RDW: 14 % (ref 11.5–15.5)

## 2013-07-26 LAB — GLUCOSE, CAPILLARY
Glucose-Capillary: 102 mg/dL — ABNORMAL HIGH (ref 70–99)
Glucose-Capillary: 108 mg/dL — ABNORMAL HIGH (ref 70–99)
Glucose-Capillary: 96 mg/dL (ref 70–99)
Glucose-Capillary: 88 mg/dL (ref 70–99)

## 2013-07-26 MED ORDER — TETANUS-DIPHTH-ACELL PERTUSSIS 5-2.5-18.5 LF-MCG/0.5 IM SUSP: 0.5000 mL | Freq: Once | INTRAMUSCULAR | Status: DC

## 2013-07-26 MED ORDER — ZOLPIDEM TARTRATE 5 MG PO TABS: 5.0000 mg | ORAL_TABLET | Freq: Every evening | ORAL | Status: DC | PRN

## 2013-07-26 MED ORDER — PHENYLEPHRINE 40 MCG/ML (10ML) SYRINGE FOR IV PUSH (FOR BLOOD PRESSURE SUPPORT)
80.0000 ug | PREFILLED_SYRINGE | INTRAVENOUS | Status: DC | PRN
  Filled 2013-07-26: qty 2
  Filled 2013-07-26: qty 10

## 2013-07-26 MED ORDER — SODIUM CHLORIDE 0.9 % IV SOLN
2.0000 g | Freq: Four times a day (QID) | INTRAVENOUS | Status: DC
  Administered 2013-07-26: 2 g via INTRAVENOUS
  Filled 2013-07-26 (×3): qty 2000

## 2013-07-26 MED ORDER — OXYCODONE-ACETAMINOPHEN 5-325 MG PO TABS
1.0000 | ORAL_TABLET | ORAL | Status: DC | PRN
  Administered 2013-07-27 – 2013-07-28 (×7): 1 via ORAL
  Filled 2013-07-26 (×8): qty 1

## 2013-07-26 MED ORDER — ERYTHROMYCIN 5 MG/GM OP OINT: 1.0000 "application " | TOPICAL_OINTMENT | Freq: Once | OPHTHALMIC | Status: DC

## 2013-07-26 MED ORDER — BENZOCAINE-MENTHOL 20-0.5 % EX AERO
1.0000 "application " | INHALATION_SPRAY | CUTANEOUS | Status: DC | PRN
  Administered 2013-07-28: 1 via TOPICAL
  Filled 2013-07-26 (×2): qty 56

## 2013-07-26 MED ORDER — DIPHENHYDRAMINE HCL 50 MG/ML IJ SOLN: 12.5000 mg | INTRAMUSCULAR | Status: DC | PRN

## 2013-07-26 MED ORDER — VITAMIN K1 1 MG/0.5ML IJ SOLN: 1.0000 mg | Freq: Once | INTRAMUSCULAR | Status: DC

## 2013-07-26 MED ORDER — FENTANYL 2.5 MCG/ML BUPIVACAINE 1/10 % EPIDURAL INFUSION (WH - ANES)
14.0000 mL/h | INTRAMUSCULAR | Status: DC | PRN
  Administered 2013-07-26 (×2): 14 mL/h via EPIDURAL
  Filled 2013-07-26 (×2): qty 125

## 2013-07-26 MED ORDER — SENNOSIDES-DOCUSATE SODIUM 8.6-50 MG PO TABS
2.0000 | ORAL_TABLET | ORAL | Status: DC
  Administered 2013-07-27 – 2013-07-28 (×2): 2 via ORAL
  Filled 2013-07-26 (×3): qty 2

## 2013-07-26 MED ORDER — EPHEDRINE 5 MG/ML INJ
10.0000 mg | INTRAVENOUS | Status: DC | PRN
  Filled 2013-07-26: qty 2
  Filled 2013-07-26: qty 4

## 2013-07-26 MED ORDER — LIDOCAINE HCL (PF) 1 % IJ SOLN
INTRAMUSCULAR | Status: DC | PRN
  Administered 2013-07-26 (×2): 5 mL

## 2013-07-26 MED ORDER — SUCROSE 24% NICU/PEDS ORAL SOLUTION
0.5000 mL | OROMUCOSAL | Status: DC | PRN
  Filled 2013-07-26: qty 0.5

## 2013-07-26 MED ORDER — WITCH HAZEL-GLYCERIN EX PADS
1.0000 "application " | MEDICATED_PAD | CUTANEOUS | Status: DC | PRN
  Administered 2013-07-26 – 2013-07-28 (×2): 1 via TOPICAL

## 2013-07-26 MED ORDER — LACTATED RINGERS IV SOLN: 500.0000 mL | Freq: Once | INTRAVENOUS | Status: DC

## 2013-07-26 MED ORDER — LANOLIN HYDROUS EX OINT: TOPICAL_OINTMENT | CUTANEOUS | Status: DC | PRN

## 2013-07-26 MED ORDER — IBUPROFEN 600 MG PO TABS
600.0000 mg | ORAL_TABLET | Freq: Four times a day (QID) | ORAL | Status: DC
  Administered 2013-07-27 – 2013-07-28 (×7): 600 mg via ORAL
  Filled 2013-07-26 (×7): qty 1

## 2013-07-26 MED ORDER — ONDANSETRON HCL 4 MG/2ML IJ SOLN: 4.0000 mg | INTRAMUSCULAR | Status: DC | PRN

## 2013-07-26 MED ORDER — ZOLPIDEM TARTRATE 5 MG PO TABS
5.0000 mg | ORAL_TABLET | Freq: Every evening | ORAL | Status: DC | PRN
  Administered 2013-07-26: 5 mg via ORAL
  Filled 2013-07-26: qty 1

## 2013-07-26 MED ORDER — DIBUCAINE 1 % RE OINT
1.0000 "application " | TOPICAL_OINTMENT | RECTAL | Status: DC | PRN
  Administered 2013-07-28: 1 via RECTAL
  Filled 2013-07-26 (×2): qty 28

## 2013-07-26 MED ORDER — GENTAMICIN SULFATE 40 MG/ML IJ SOLN
230.0000 mg | Freq: Three times a day (TID) | INTRAVENOUS | Status: DC
  Administered 2013-07-26: 230 mg via INTRAVENOUS
  Filled 2013-07-26 (×2): qty 5.75

## 2013-07-26 MED ORDER — ONDANSETRON HCL 4 MG PO TABS: 4.0000 mg | ORAL_TABLET | ORAL | Status: DC | PRN

## 2013-07-26 MED ORDER — SIMETHICONE 80 MG PO CHEW
80.0000 mg | CHEWABLE_TABLET | ORAL | Status: DC | PRN
  Administered 2013-07-27 – 2013-07-28 (×3): 80 mg via ORAL
  Filled 2013-07-26 (×2): qty 1

## 2013-07-26 MED ORDER — PHENYLEPHRINE 40 MCG/ML (10ML) SYRINGE FOR IV PUSH (FOR BLOOD PRESSURE SUPPORT)
80.0000 ug | PREFILLED_SYRINGE | INTRAVENOUS | Status: DC | PRN
  Filled 2013-07-26: qty 2

## 2013-07-26 MED ORDER — DIPHENHYDRAMINE HCL 25 MG PO CAPS: 25.0000 mg | ORAL_CAPSULE | Freq: Four times a day (QID) | ORAL | Status: DC | PRN

## 2013-07-26 MED ORDER — EPHEDRINE 5 MG/ML INJ
10.0000 mg | INTRAVENOUS | Status: DC | PRN
  Filled 2013-07-26: qty 2

## 2013-07-26 MED ORDER — PRENATAL MULTIVITAMIN CH
1.0000 | ORAL_TABLET | Freq: Every day | ORAL | Status: DC
  Administered 2013-07-27 – 2013-07-28 (×2): 1 via ORAL
  Filled 2013-07-26 (×2): qty 1

## 2013-07-26 MED ORDER — FENTANYL CITRATE 0.05 MG/ML IJ SOLN
100.0000 ug | Freq: Once | INTRAMUSCULAR | Status: AC
  Administered 2013-07-26: 100 ug via INTRAVENOUS
  Filled 2013-07-26: qty 2

## 2013-07-26 MED ORDER — HEPATITIS B VAC RECOMBINANT 10 MCG/0.5ML IJ SUSP: 0.5000 mL | Freq: Once | INTRAMUSCULAR | Status: DC

## 2013-07-26 NOTE — Anesthesia Procedure Notes (Signed)
Epidural Patient location during procedure: OB Start time: 07/26/2013 8:10 AM  Staffing Anesthesiologist: Brayton Caves Performed by: anesthesiologist   Preanesthetic Checklist Completed: patient identified, site marked, surgical consent, pre-op evaluation, timeout performed, IV checked, risks and benefits discussed and monitors and equipment checked  Epidural Patient position: sitting Prep: site prepped and draped and DuraPrep Patient monitoring: continuous pulse ox and blood pressure Approach: midline Injection technique: LOR air  Needle:  Needle type: Tuohy  Needle gauge: 17 G Needle length: 9 cm and 9 Needle insertion depth: 9 cm Catheter type: closed end flexible Catheter size: 19 Gauge Catheter at skin depth: 15 cm Test dose: negative  Assessment Events: blood not aspirated, injection not painful, no injection resistance, negative IV test and no paresthesia  Additional Notes Patient identified.  Risk benefits discussed including failed block, incomplete pain control, headache, nerve damage, paralysis, blood pressure changes, nausea, vomiting, reactions to medication both toxic or allergic, and postpartum back pain.  Patient expressed understanding and wished to proceed.  All questions were answered.  Sterile technique used throughout procedure and epidural site dressed with sterile barrier dressing. No paresthesia or other complications noted.The patient did not experience any signs of intravascular injection such as tinnitus or metallic taste in mouth nor signs of intrathecal spread such as rapid motor block. Please see nursing notes for vital signs.

## 2013-07-26 NOTE — Progress Notes (Signed)
ANTIBIOTIC CONSULT NOTE - INITIAL  Pharmacy Consult for Gentamicin Indication: Maternal temp  No Known Allergies  Patient Measurements: Height: 5\' 7"  (170.2 cm) Weight: 289 lb (131.09 kg) IBW/kg (Calculated) : 61.6 Adjusted Body Weight: 82.5 kg    Vital Signs: Temp: 101 F (38.3 C) (10/31 1431) Temp src: Oral (10/31 1431) BP: 133/66 mmHg (10/31 1502) Pulse Rate: 112 (10/31 1502)  Labs:  Recent Labs  07/25/13 0830 07/25/13 1755 07/25/13 1800 07/26/13 0700  WBC 15.3*  --   --  18.8*  HGB 11.6*  --   --  11.4*  PLT 226  --   --  197  LABCREA  --  84.43  --   --   CREATININE  --   --  0.59  --    No results found for this basename: GENTTROUGH, GENTPEAK, GENTRANDOM,  in the last 72 hours   Microbiology:   Medications:  Ampicillin 2g IV Q6 hours.   Assessment: 32 y.o. female G2P1001 at [redacted]w[redacted]d admitted for induction of labor due to gestational diabetes. Mom with fever of 101 and fetal tachycardia.  Estimated Ke = 0.398, Vd = 0.39 L/kg  Goal of Therapy:  Gentamicin peak 6-8 mg/L and Trough < 1 mg/L  Plan:  Gentamicin 230 mg IV every 8 hrs  Check Scr with next labs if gentamicin continued. Will check gentamicin levels if continued > 72hr or clinically indicated.  Phillippe Orlick Scarlett 07/26/2013,3:22 PM

## 2013-07-26 NOTE — Progress Notes (Signed)
[name] is a 32 y.o. G2P1001 at [redacted]w[redacted]d admitted for induction of labor due to Gestational diabetes.  Subjective: Doing well with epidural. No complaints, now febrile to 101, fetal tachycardia  Objective: BP 119/60  Pulse 96  Temp(Src) 98.9 F (37.2 C) (Oral)  Resp 18  Ht 5\' 7"  (1.702 m)  Wt 289 lb (131.09 kg)  BMI 45.25 kg/m2  SpO2 100%  LMP 09/26/2012   Total I/O In: -  Out: 200 [Urine:200]  FHT:  FHR: 160s bpm, variability: minimal ,  accelerations:  Abscent,  decelerations:  Present occassional lates UC:   regular, every 3 minutes SVE:   Dilation: 9 Effacement (%): 90 Station: -1;0 Exam by:: Enis Slipper, RN  Labs: Lab Results  Component Value Date   WBC 18.8* 07/26/2013   HGB 11.4* 07/26/2013   HCT 33.7* 07/26/2013   MCV 88.7 07/26/2013   PLT 197 07/26/2013    Assessment / Plan: Induction of labor due to gestational diabetes,  progressing well on pitocin  Labor: Progressing normally and however now with chorio. will allow to labor down for 1 hr, then will start pushing Preeclampsia:  no signs or symptoms of toxicity Fetal Wellbeing:  Category II Pain Control:  Epidural I/D:  chrio, start amp and gent Anticipated MOD:  NSVD  [name] 07/26/2013, 2:47 PM

## 2013-07-26 NOTE — Anesthesia Preprocedure Evaluation (Signed)
Anesthesia Evaluation  Patient identified by MRN, date of birth, ID band Patient awake    Reviewed: Allergy & Precautions, H&P , Patient's Chart, lab work & pertinent test results  History of Anesthesia Complications (+) PONV and history of anesthetic complications  Airway Mallampati: III TM Distance: >3 FB Neck ROM: full    Dental no notable dental hx.    Pulmonary neg pulmonary ROS,  breath sounds clear to auscultation  Pulmonary exam normal       Cardiovascular hypertension, negative cardio ROS  Rhythm:regular Rate:Normal     Neuro/Psych PSYCHIATRIC DISORDERS Anxiety Depression negative neurological ROS  negative psych ROS   GI/Hepatic negative GI ROS, Neg liver ROS,   Endo/Other  negative endocrine ROSdiabetesMorbid obesity  Renal/GU negative Renal ROS     Musculoskeletal   Abdominal   Peds  Hematology negative hematology ROS (+)   Anesthesia Other Findings   Reproductive/Obstetrics (+) Pregnancy                           Anesthesia Physical Anesthesia Plan  ASA: III  Anesthesia Plan: Epidural   Post-op Pain Management:    Induction:   Airway Management Planned:   Additional Equipment:   Intra-op Plan:   Post-operative Plan:   Informed Consent: I have reviewed the patients History and Physical, chart, labs and discussed the procedure including the risks, benefits and alternatives for the proposed anesthesia with the patient or authorized representative who has indicated his/her understanding and acceptance.     Plan Discussed with:   Anesthesia Plan Comments:         Anesthesia Quick Evaluation

## 2013-07-26 NOTE — Progress Notes (Signed)
[name] is a 32 y.o. G2P1001 at [redacted]w[redacted]d  admitted for induction of labor due to Gestational diabetes and Hypertension.  Subjective: Pt started hurting more with contractions about 1 hour ago that woke her up from sleep.  Believes her water might have broken because she felt some liquid come out when she was on the toilet.  +FM.    Objective: BP 126/69  Pulse 113  Temp(Src) 97.6 F (36.4 C) (Oral)  Resp 20  Ht 5\' 7"  (1.702 m)  Wt 131.09 kg (289 lb)  BMI 45.25 kg/m2  LMP 09/26/2012      FHT:  FHR: 145-150 bpm, variability: moderate,  accelerations:  Present,  decelerations:  Absent UC:   regular, every 2-3 minutes in last hour SVE:   Dilation: 4.5 Effacement (%): 70 Station: -3 Exam by:: dr Reola Calkins  Labs: Lab Results  Component Value Date   WBC 18.8* 07/26/2013   HGB 11.4* 07/26/2013   HCT 33.7* 07/26/2013   MCV 88.7 07/26/2013   PLT 197 07/26/2013    Assessment / Plan: IOL for A2DM and cHTN. has been on pitocin at 63mu/min overnight for cervical ripening and now beginning to hurt with contractions.   Labor: likely begin to progress now. start titrating pit as tolerated. palpable bag around baby so likely if ruptured only a high leak.  HTN- stable. No severe range pressures A2DM- sugars all controlled  Fetal Wellbeing:  Category I Pain Control:  wanting epidural now I/D:  n/a Anticipated MOD:  NSVD- pt +with somewhat limited understanding despite discussion of risks given EFW and her TOLAC status. Cont to readdress with her as the labor process progresses.   Raiford Fetterman L 07/26/2013, 7:36 AM

## 2013-07-27 NOTE — Progress Notes (Signed)
Clinical Social Work Department PSYCHOSOCIAL ASSESSMENT - MATERNAL/CHILD 07/27/2013  Patient:  DebraDebra Snyder  Account Number:  [id number]  Admit Date:  07/25/2013  Childs Name:   [name]    Clinical Social Worker:  Tymarion Everard, LCSW   Date/Time:  07/27/2013 09:45 AM  Date Referred:  07/26/2013   Referral source  Central Nursery     Referred reason  Psychosocial assessment   Other referral source:    I:  FAMILY / HOME ENVIRONMENT Child'Snyder legal guardian:  PARENT  Guardian - Name Guardian - Age Guardian - Address  DebraJosi Snyder 31 [address]  [place], [place] [address]  [name] 30 same as above   Other household support members/support persons Other support:   Arnetra Brownrigg Maternal grandmother    II  PSYCHOSOCIAL DATA Information Source:  Patient Interview  Financial and Community Resources Employment:   Mother is employed with [employer]  FOB is also employed   Financial resources:  Private Insurance If Medicaid - County:   Other  WIC  Food Stamps   School / Grade:   Maternity Care Coordinator / Child Services Coordination / Early Interventions:  Cultural issues impacting care:    III  STRENGTHS Strengths  Adequate Resources  Home prepared for Child (including basic supplies)  Supportive family/friends    V  SOCIAL WORK ASSESSMENT Acknowledged MD order for social work consult.   Informed that FOB refuses to be involved because he wanted mother to terminate the pregnancy.  Met with mother who was pleasant and receptive to social work intervention.  She is a single parent with one other dependent age 2.  Mother states that she and FOB cohabitate and he is the father of her other child.  However, he is questioning paternity and limits his interaction with her and the baby.  Informed that he was present when she went into labor, but left right after the baby was delivered.  He'Snyder also refusing to sign the birth certificate.   Informed that she has family support. Maternal grandmother will be staying with her for two weeks.  She reports hx of anxiety and depression and states that she was on klonopin and Zoloft but stopped taking the medication when she became pregnant.  She communicates plans to follow up with her PCP to restart these medications.  She also reports seeing a therapist weekly. She denies any current depressive symptoms.  She denies any hx of substance abuse.  Discussed signs/symptoms of PP depression with mother.  She was receptive to the information.  Both she and FOB are employed and she communicates plans to return to work in about 10 weeks.  No acute social concerns noted at this time.  Mother informed of social work availability.     VI SOCIAL WORK PLAN Social Work Plan  No Further Intervention Required / No Barriers to Discharge   Courtney Fenlon J, LCSW  

## 2013-07-27 NOTE — Anesthesia Postprocedure Evaluation (Signed)
  Anesthesia Post-op Note  Patient: [name]  Procedure(s) Performed: * No procedures listed *  Patient Location: Mother/Baby  Anesthesia Type:Epidural  Level of Consciousness: awake  Airway and Oxygen Therapy: Patient Spontanous Breathing  Post-op Pain: mild  Post-op Assessment: Patient's Cardiovascular Status Stable and Respiratory Function Stable  Post-op Vital Signs: stable  Complications: No apparent anesthesia complications

## 2013-07-27 NOTE — Lactation Note (Signed)
This note was copied from the chart of [name]. Lactation Consultation Note Initial consultation; mom's second baby; mom pumped for 4 months for her first child because he would not latch. Mom intends to breast feed this baby.  Mom states baby just finished a feeding, and is breast feeding very well. Mom's friend holding baby, baby asleep. Mom does state that she is having some nipple pain with latch. Nipples are intact. Reviewed br feeding basics, esp position and latch. Enc mom to always try to get baby's mouth very wide for latch. Discussed hand expression, mom return demonstration. Enc mom to hand express before feeding. Reviewed baby and me book, lactation brochure, community resources, BFSG.  Patient Name: Debra Amberley Hamler ZOXWR'[id number] Date: 07/27/2013 Reason for consult: Initial assessment   Maternal Data Formula Feeding for Exclusion: No Infant to breast within first hour of birth: Yes Has patient been taught Hand Expression?: Yes Does the patient have breastfeeding experience prior to this delivery?: Yes  Feeding    LATCH Score/Interventions                      Lactation Tools Discussed/Used     Consult Status Consult Status: Follow-up    Octavio Manns [facility] 07/27/2013, 1:45 PM

## 2013-07-27 NOTE — Progress Notes (Signed)
Post Partum Day 1 s/p VBAC Subjective: Sore but otherwise no complaints, up ad lib, voiding and tolerating PO, lochia appropriate, she is breast feeding.   Objective: Blood pressure 92/67, pulse 133, temperature 97.6 F (36.4 C), temperature source Oral, resp. rate 24, height 5\' 7"  (1.702 m), weight 131.09 kg (289 lb), last menstrual period 09/26/2012, SpO2 100.00%, unknown if currently breastfeeding.  Physical Exam:  General: alert, cooperative and no distress Lochia: appropriate Chest: CTAB Heart: RRR no m/r/g Abdomen: +BS, soft, nontender,  Uterine Fundus: firm DVT Evaluation: No evidence of DVT seen on physical exam. Extremities: 1+ bilateral lower extremity edema   Recent Labs  07/26/13 0700 07/26/13 2016  HGB 11.4* 11.9*  HCT 33.7* 34.7*    Assessment/Plan: 1. S/p VBAC. Doing well. Continue routine postpartum care.  2. Chronic hypertension: Has never required any antihypertensive medication. BPs normotensive. 3. She delivered yesterday evening so will plan for discharge tomorrow.  4. She is breast feeding.  5. She plans to use Nexplanon for postpartum contraception.  6. She will [facility] at [facility] for her postpartum follow up.     LOS: 2 days   [place], Ryann 07/27/2013, 7:33 AM    I have seen and examined this patient and I agree with the above. Plan on mental health checkup at [facility] in 1-2 weeks due to hx of PP depression. Cam Hai 9:17 AM 07/27/2013

## 2013-07-28 MED ORDER — IBUPROFEN 600 MG PO TABS: 600.0000 mg | ORAL_TABLET | Freq: Four times a day (QID) | ORAL | Status: DC

## 2013-07-28 NOTE — Discharge Summary (Signed)
Obstetric Discharge Summary Reason for Admission: induction of labor  For cHTN and A2DM Prenatal Procedures: NST Intrapartum Procedures: spontaneous vaginal delivery Postpartum Procedures: none Complications-Operative and Postpartum: 2nd degree perineal laceration Hemoglobin  Date Value Range Status  07/26/2013 11.9* 12.0 - 15.0 g/dL Final     HCT  Date Value Range Status  07/26/2013 34.7* 36.0 - 46.0 % Final    Physical Exam:  General: alert, cooperative and no distress Lochia: appropriate Uterine Fundus: firm Incision: na  DVT Evaluation: No evidence of DVT seen on physical exam.  Discharge Diagnoses: Term Pregnancy-delivered, chronic HTN, A2DM  Discharge Information: Date: 07/28/2013 Activity: pelvic rest Diet: routine Medications: PNV and Ibuprofen Condition: stable Instructions: refer to practice specific booklet Discharge to: home Follow-up Information   Follow up with [facility] [place] In 2 weeks. (Needs to be assessed for postpartum depresson in 1-2 weeks at [facility] due to history.)    Specialty:  Obstetrics and Gynecology   Contact information:   [address], [address] [place] [address] [phone]      Follow up with [facility] [place]. Schedule an appointment as soon as possible for a visit in 4 weeks.   Specialty:  Obstetrics and Gynecology   Contact information:   [address], [address] [place] [address] [phone]      Newborn Data: Live born female  Birth Weight: 8 lb 4.6 oz (3760 g) APGAR: 8, 9  Home with mother.  Pt presented as an IOL for cHTN and A2DM and progressed to deliver a liveborn female via NSVD after 3 hours of pushing (successful VBAC). Post partum care was uncomplicated and BP remained stable. She will need a 2 hour glucola at 6 weeks given her hx of A2DM.  She is breast feeding and desiring nexplanon for contraception.       Treshon Stannard L 07/28/2013, 11:13 AM

## 2013-07-28 NOTE — Discharge Summary (Signed)
Attestation of Attending Supervision of Fellow: Evaluation and management procedures were performed by the Fellow under my supervision and collaboration.  I have reviewed the Fellow's note and chart, and I agree with the management and plan.    

## 2013-07-29 ENCOUNTER — Ambulatory Visit: Payer: Self-pay

## 2013-07-29 NOTE — Lactation Note (Signed)
This note was copied from the chart of [name]. Lactation Consultation Note  Patient Name: Debra Alexsia Klindt ZOXWR'U Date: 07/29/2013 Reason for consult: Follow-up assessment;NICU baby Baby admitted to NICU this afternoon. Mom given NICU booklet and reviewed pumping schedule/storage guidelines with Mom. Mom has [employer]meda pump for home use. Advised to pump every 3 hours for 15-20 minutes, should be 8-12 times in 24 hours. To follow up with Mom tomorrow.   Maternal Data    Feeding Feeding Type: Other (comment) (bollte fed enfamil and EBM) Length of feed: 0 min  LATCH Score/Interventions                      Lactation Tools Discussed/Used     Consult Status Consult Status: Follow-up Date: 07/30/13 Follow-up type: In-patient    Alfred Levins 07/29/2013, 5:00 PM

## 2013-07-31 NOTE — Progress Notes (Signed)
Post discharge chart review completed.  

## 2013-08-09 ENCOUNTER — Ambulatory Visit (INDEPENDENT_AMBULATORY_CARE_PROVIDER_SITE_OTHER): Payer: Managed Care, Other (non HMO) | Admitting: Family

## 2013-08-09 ENCOUNTER — Encounter: Payer: Self-pay | Admitting: Family

## 2013-08-09 VITALS — BP 129/84 | HR 84 | Ht 67.0 in | Wt 266.0 lb

## 2013-08-09 DIAGNOSIS — O99345 Other mental disorders complicating the puerperium: Secondary | ICD-10-CM

## 2013-08-09 DIAGNOSIS — Z09 Encounter for follow-up examination after completed treatment for conditions other than malignant neoplasm: Secondary | ICD-10-CM

## 2013-08-09 DIAGNOSIS — K429 Umbilical hernia without obstruction or gangrene: Secondary | ICD-10-CM

## 2013-08-09 MED ORDER — SERTRALINE HCL 50 MG PO TABS: 50.0000 mg | ORAL_TABLET | Freq: Every day | ORAL | Status: DC

## 2013-08-09 NOTE — Progress Notes (Signed)
  Subjective:    Patient ID: [name], female    DOB: 1981/05/09, 32 y.o.   MRN: [id number]  HPI Pt is a 32 yo G2P2002 here two week postpartum with concerns of postpartum depression.  Reports felt more depressed a week ago, symptoms are improving.  Denies thoughts of harming self or others.  Desires to get restarted on zoloft.  Pt has a therapist that she use to see with her last appointment in March 2014.  Pt also report intermittent pain in area of umbilical hernia.  Pain is not constant.  No change in bowel pattern.     Review of Systems  Constitutional: Negative for fever and appetite change.  Gastrointestinal: Positive for abdominal distention.  Genitourinary: Negative for vaginal bleeding.       Objective:   Physical Exam  Constitutional: She is oriented to person, place, and time. She appears well-developed and well-nourished.  HENT:  Head: Normocephalic and atraumatic.  Neck: Normal range of motion. Neck supple. No thyromegaly present.  Cardiovascular: Normal rate, regular rhythm and normal heart sounds.   Pulmonary/Chest: Effort normal and breath sounds normal. No respiratory distress.  Abdominal: Soft. Bowel sounds are normal. There is tenderness.    Neurological: She is alert and oriented to person, place, and time.  Skin: Skin is warm and dry.  Psychiatric: She has a normal mood and affect. Her behavior is normal. She expresses no suicidal plans and no homicidal plans.          Assessment & Plan:  Hx of Depression Umbilical Hernia  Plan: RX Zoloft 50 mg QD Pt scheduled appointment today at 10:00 am with PCP for arrangement of surgical consult Keep postpartum appointment in 4 weeks. [facility]

## 2013-09-06 ENCOUNTER — Ambulatory Visit (INDEPENDENT_AMBULATORY_CARE_PROVIDER_SITE_OTHER): Payer: Managed Care, Other (non HMO) | Admitting: Family

## 2013-09-06 ENCOUNTER — Encounter: Payer: Self-pay | Admitting: Family

## 2013-09-06 MED ORDER — SERTRALINE HCL 50 MG PO TABS: 50.0000 mg | ORAL_TABLET | Freq: Every day | ORAL | Status: DC

## 2013-09-06 NOTE — Progress Notes (Signed)
  Subjective:     [name] is a 32 y.o. female who presents for a postpartum visit. She is 6 weeks postpartum following a spontaneous vaginal delivery. I have fully reviewed the prenatal and intrapartum course. The delivery was at 39 gestational weeks. Outcome: spontaneous vaginal delivery. Anesthesia: epidural. Postpartum course has been marked by abdominal hernia, surgery scheduled for Monday, December 15th. Baby's course has been hospitalized x 2 since delivery.  Most recent was 2 day hospitalization for facial cellulitis, given oral antibiotics. Baby is feeding by breast. Bleeding brown. Red bleeding stopped two weeks ago.  Bowel function is normal. Bladder function is normal. Patient is not sexually active. Contraception method is Nexplanon. Postpartum depression screening: negative.  Pt reports that not feeling depressed, did not begin Zoloft, but would like another RX, lost previous one.  Desires Nexplanon, will call back to schedule when ready to place.    The following portions of the patient's history were reviewed and updated as appropriate: allergies, current medications, past family history, past medical history, past social history, past surgical history and problem list.  Review of Systems Pertinent items are noted in HPI.   Objective:    BP 132/87  Pulse 88  Resp 18  Ht 5\' 7"  (1.702 m)  Wt 265 lb (120.203 kg)  BMI 41.50 kg/m2  Breastfeeding? Yes        General:  alert, cooperative and appears stated age   Breasts:  inspection negative, no nipple discharge or bleeding, no masses or nodularity palpable  Lungs: clear to auscultation bilaterally  Heart:  regular rate and rhythm, S1, S2 normal, no murmur, click, rub or gallop  Abdomen: soft, non-tender; bowel sounds normal; no masses,  no organomegaly   Vulva:  normal  Vagina: normal vagina, no discharge, exudate, lesion, or erythema; healed well.  No bleeding seen.    Cervix:  no cervical motion tenderness  Corpus:  normal size, contour, position, consistency, mobility, non-tender  Adnexa:  normal adnexa  Rectal Exam: Not performed.    Assessment:   Normal postpartum exam. Pap smear not done at today's visit.  Hx of Depression Abdominal Hernia  Plan:    1. Contraception: none; will call and schedule appointment for Nexplanon when ready. 2. Hernia surgery on Monday. 3. Follow up as needed.

## 2013-09-15 IMAGING — US US OB FOLLOW-UP
1 series · 12 of 28 positions shown · non-contrast
Comparison: none

[Series 1: us ob follow up · 12 of 44 slices shown]
[im 2/44]
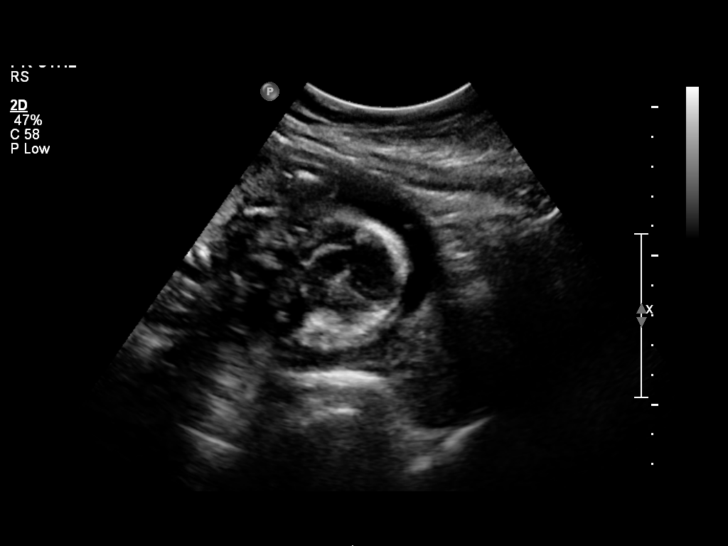
[im 5/44]
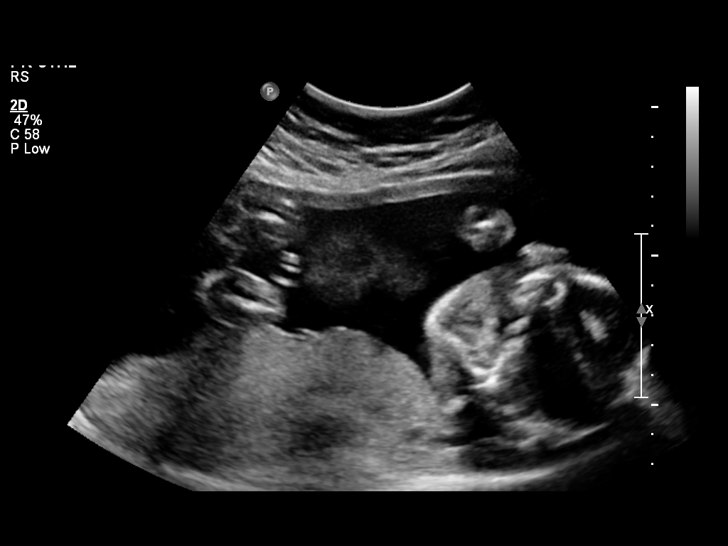
[im 8/44]
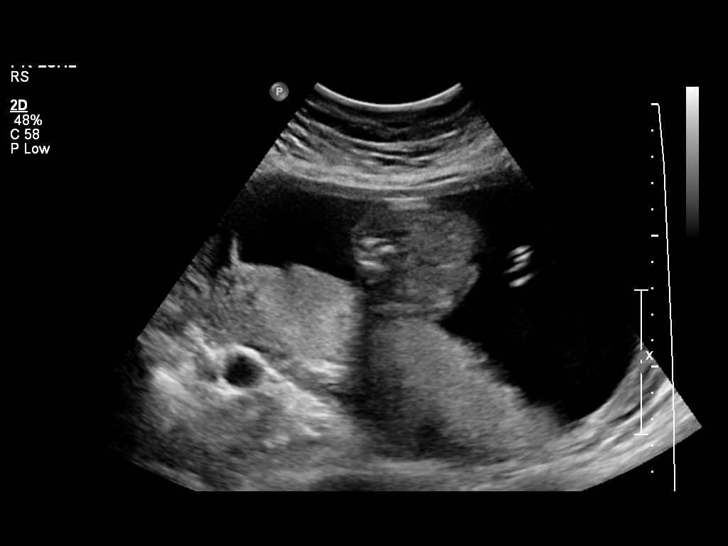
[im 13/44]
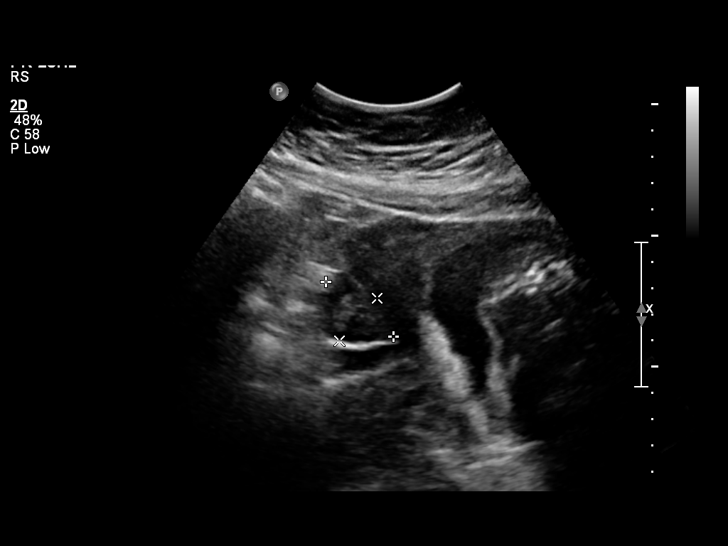
[im 16/44]
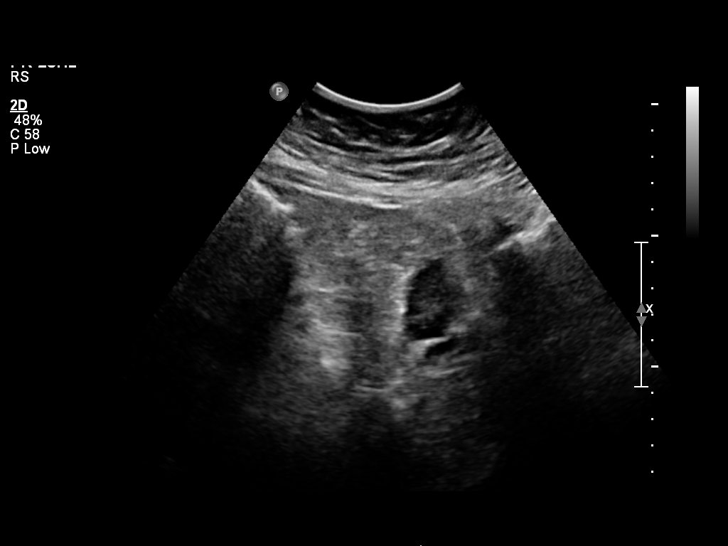
[im 20/44]
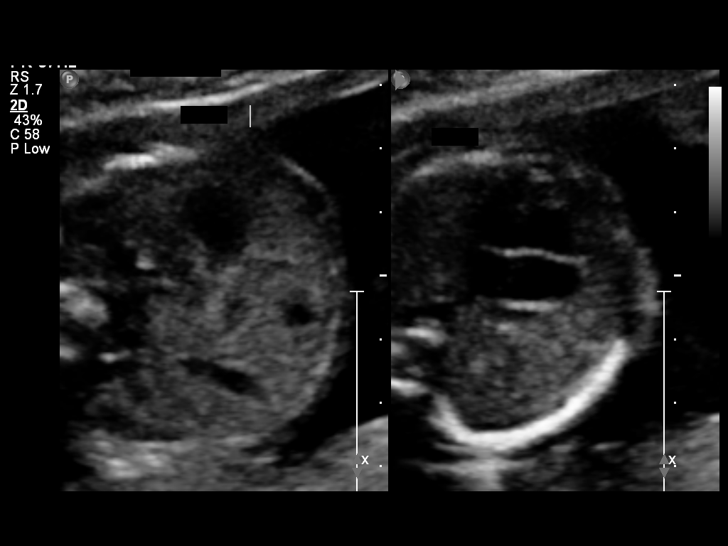
[im 24/44]
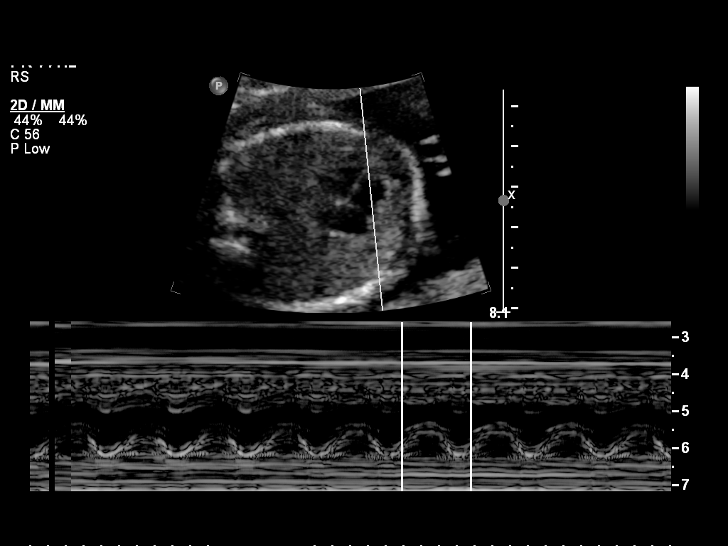
[im 28/44]
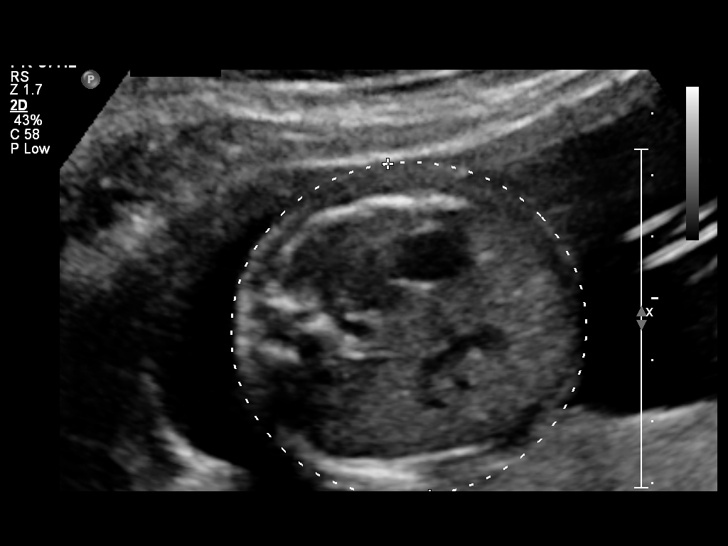
[im 31/44]
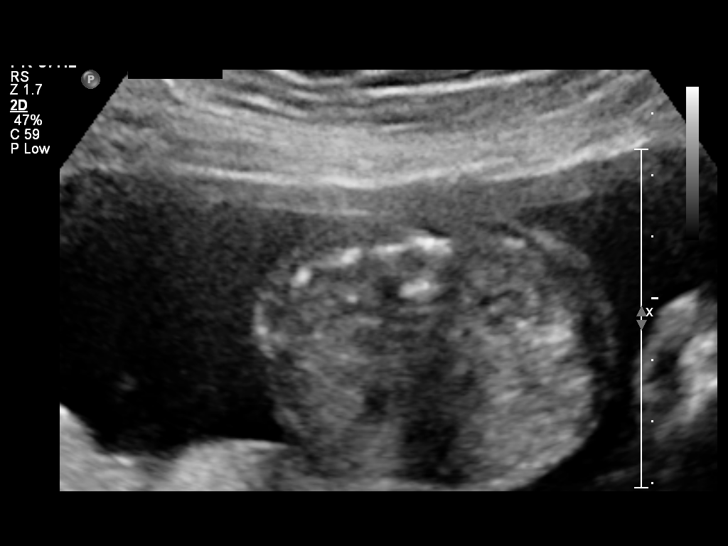
[im 36/44]
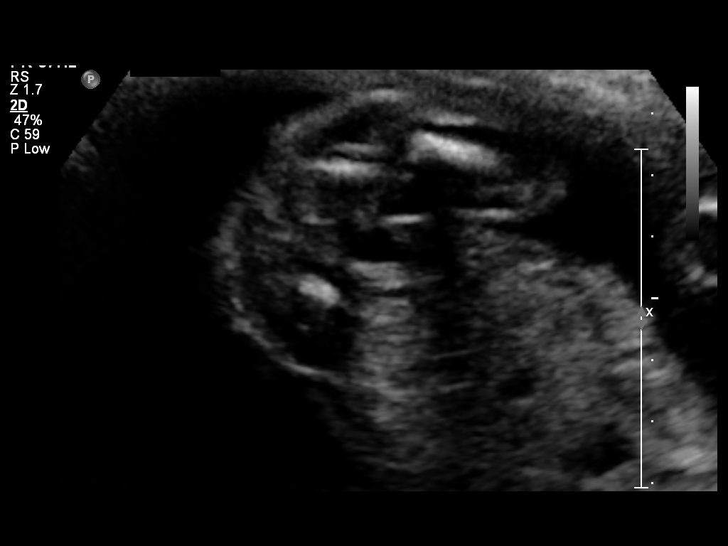
[im 39/44]
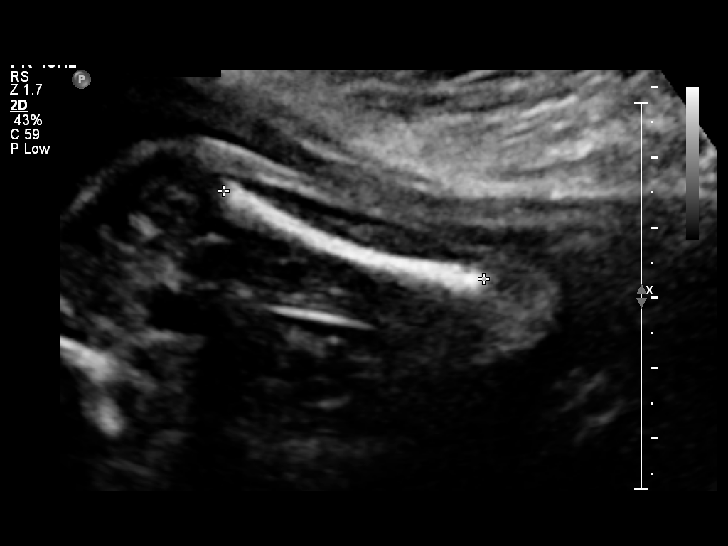
[im 42/44]
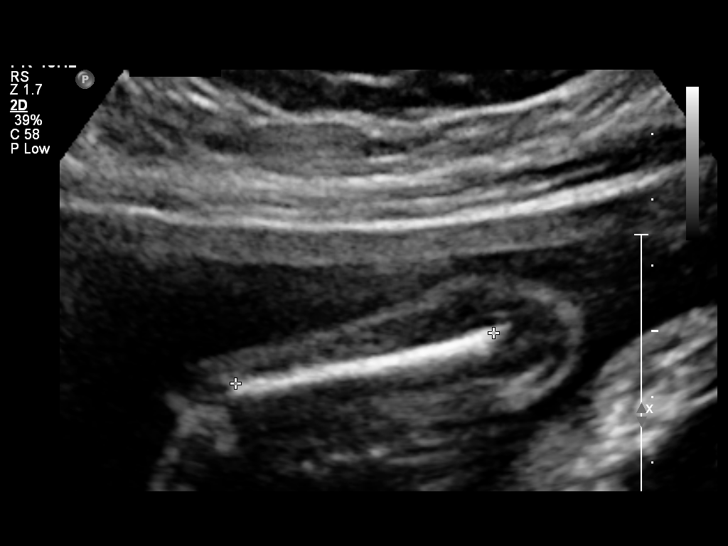

[12 of 28 positions shown; findings below may reference images not displayed]

OBSTETRICS REPORT
                      (Signed Final 03/25/2013 [DATE])

                                                         CNM
Service(s) Provided

 US OB FOLLOW UP                                       76816.1
Indications

 History of cesarean delivery, currently pregnant      654.20,
 Maternal obesity (286 lb)
 Diabetes - Gestational, A1 (diet controlled)
 Hypertension - Chronic/Pre-existing
Fetal Evaluation

 Num Of Fetuses:    1
 Fetal Heart Rate:  155                         bpm
 Cardiac Activity:  Observed
 Presentation:      Cephalic
 Placenta:          Posterior, above cervical
                    os
 P. Cord            Previously Visualized
 Insertion:

 Amniotic Fluid
 AFI FV:      Subjectively within normal limits
                                             Larg Pckt:     7.2  cm
Biometry

 BPD:     52.3  mm    G. Age:   21w 6d                CI:        73.85   70 - 86
                                                      FL/HC:      20.5   18.4 -

 HC:     193.3  mm    G. Age:   21w 4d       40  %    HC/AC:      1.12   1.06 -

 AC:     173.2  mm    G. Age:   22w 2d       65  %    FL/BPD:     75.7   71 - 87
 FL:      39.6  mm    G. Age:   22w 5d       78  %    FL/AC:      22.9   20 - 24
 HUM:     39.4  mm    G. Age:   24w 0d     > 95  %

 Est. FW:     498  gm      1 lb 2 oz     58  %
Gestational Age

 LMP:           25w 5d       Date:   09/26/12                 EDD:   07/03/13
 U/S Today:     22w 1d                                        EDD:   07/28/13
 Best:          21w 4d    Det. By:   Early Ultrasound         EDD:   08/01/13
                                     (12/27/12)
Anatomy

 Cranium:          Appears normal         Aortic Arch:      Previously seen
 Fetal Cavum:      Previously seen        Ductal Arch:      Previously seen
 Ventricles:       Appears normal         Diaphragm:        Previously seen
 Choroid Plexus:   Previously seen        Stomach:          Appears normal
 Cerebellum:       Previously seen        Abdomen:          Appears normal
 Posterior Fossa:  Previously seen        Abdominal Wall:   Previously seen
 Nuchal Fold:      Previously seen        Cord Vessels:     Previously seen
 Face:             Orbits previously      Kidneys:          Appear normal
                   seen
 Lips:             Previously seen        Bladder:          Appears normal
 Heart:            Appears normal         Spine:            Previously seen
                   (4CH, axis, and
                   situs)
 RVOT:             Not well visualized    Lower             Previously seen
                                          Extremities:
 LVOT:             Previously seen        Upper             Previously seen
                                          Extremities:

 Other:  Male gender previously. Heels and 5th digit previously visualized.
         Nasal bone previously visualized. Technically difficult due to maternal
         habitus and fetal position.
Targeted Anatomy

 Fetal Central Nervous System
 Lat. Ventricles:
Cervix Uterus Adnexa

 Cervical Length:   3.76      cm

 Cervix:       Normal appearance by transabdominal scan.
 Left Ovary:   Size(cm) L: 3.18 x W: 2.14 x H: 1.79  Volume(cc):
 Right Ovary:  Size(cm) L: 3.3 x W: 3.11 x H: 2.16  Volume(cc):

 Adnexa:     No abnormality visualized.
Impression

 Single live IUP in cephalic presentation.  Concordant
 measurements/assigned GA by US.
 No late-developing anomaly in visualized structures above.

## 2013-09-24 ENCOUNTER — Telehealth: Payer: Self-pay | Admitting: *Deleted

## 2013-09-24 DIAGNOSIS — IMO0002 Reserved for concepts with insufficient information to code with codable children: Secondary | ICD-10-CM

## 2013-09-24 MED ORDER — METOCLOPRAMIDE HCL 10 MG PO TABS: ORAL_TABLET | ORAL | Status: DC

## 2013-09-24 NOTE — Telephone Encounter (Signed)
Pt called and stated that she has had a decreased from 6 oz of breast milk down to 1 oz.  She states that she has been taking Fenugreek and has seen no changes.  Requesting a RX for Reglan, spoke with Dr Penne Lash who authorized a RX to be sent to pharmacy.

## 2013-09-30 ENCOUNTER — Encounter: Payer: Self-pay | Admitting: Obstetrics & Gynecology

## 2013-10-20 IMAGING — US US OB FOLLOW-UP
1 series · 12 of 28 positions shown · non-contrast
Comparison: none

[Series 1: us ob follow up · 12 of 32 slices shown]
[im 2/32]
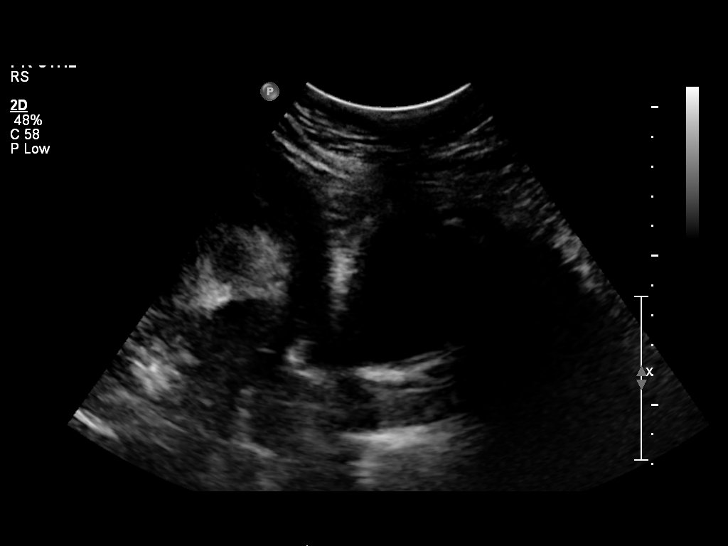
[im 4/32]
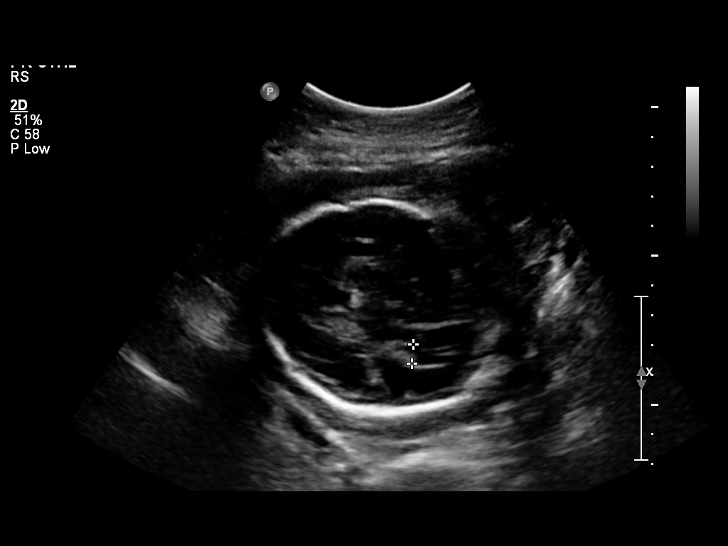
[im 6/32]
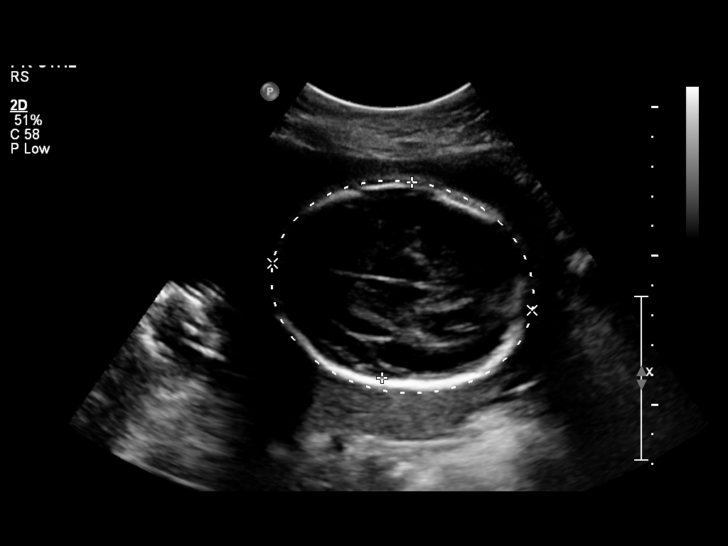
[im 10/32]
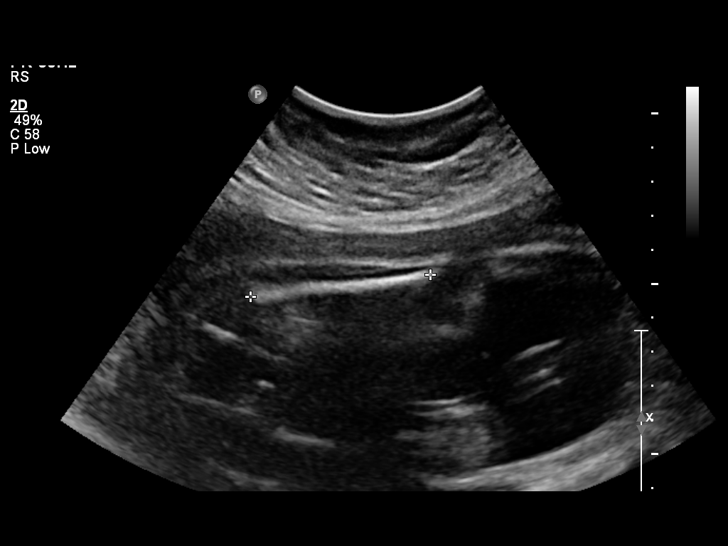
[im 12/32]
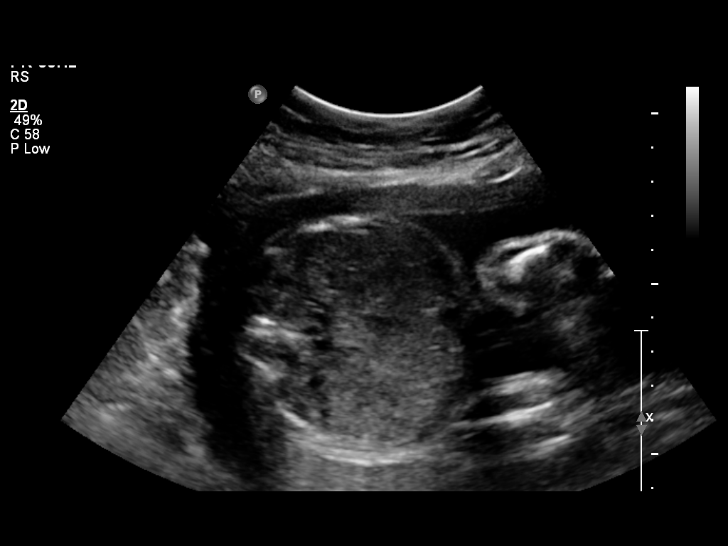
[im 14/32]
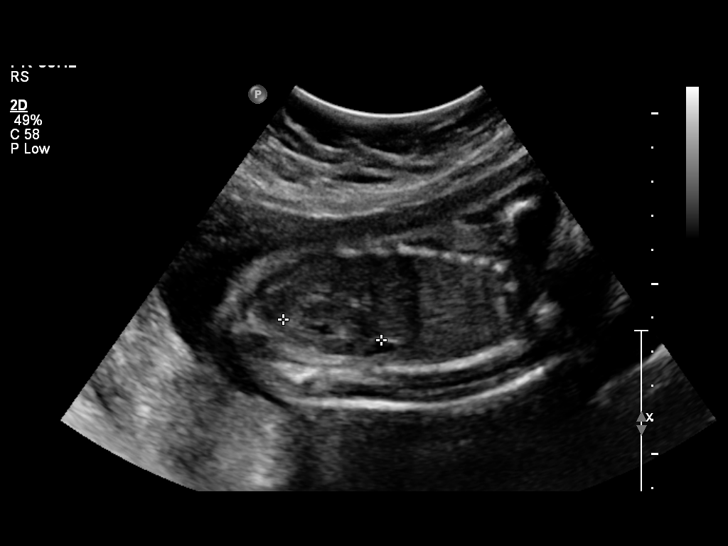
[im 18/32]
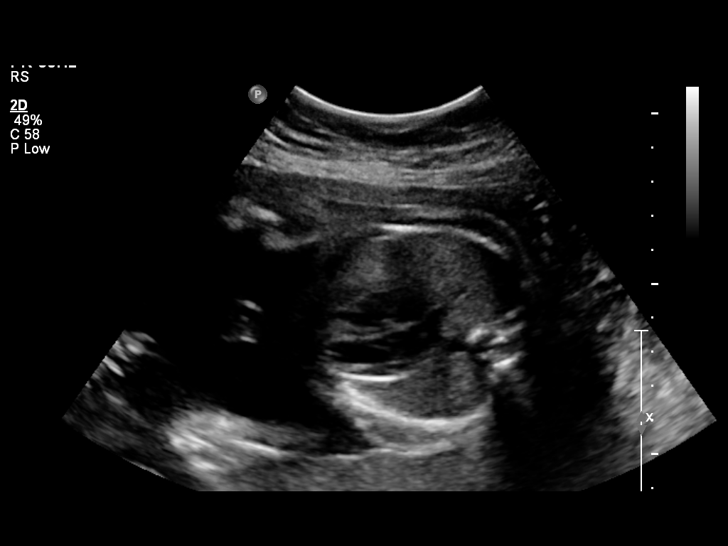
[im 20/32]
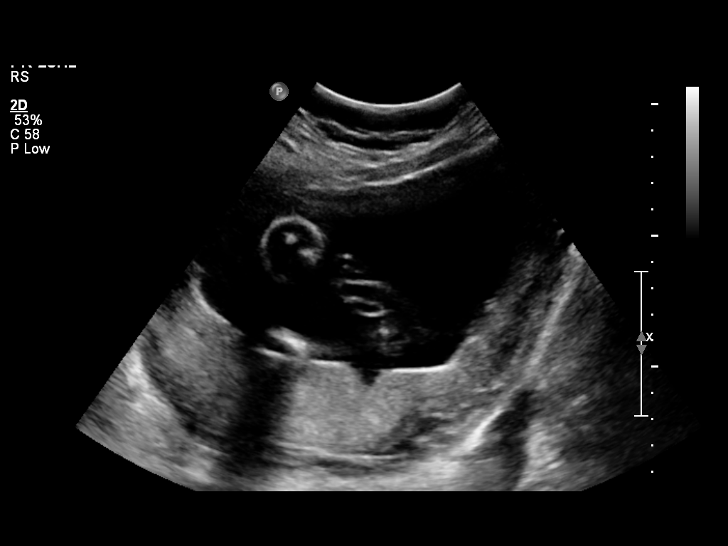
[im 22/32]
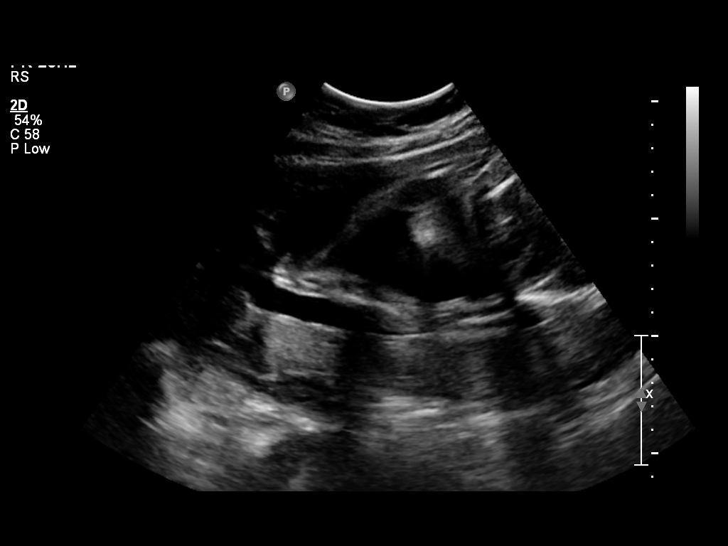
[im 26/32]
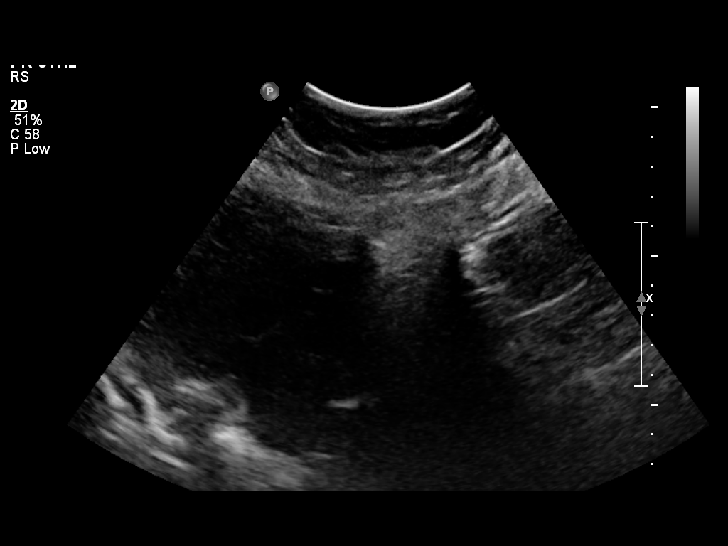
[im 28/32]
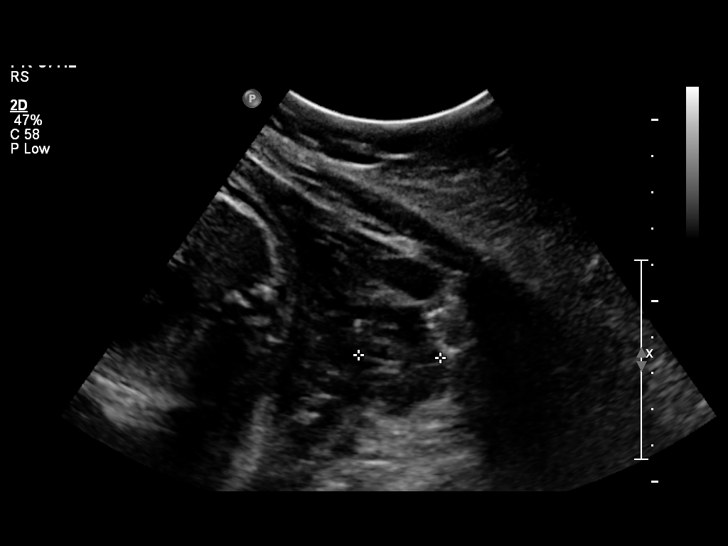
[im 30/32]
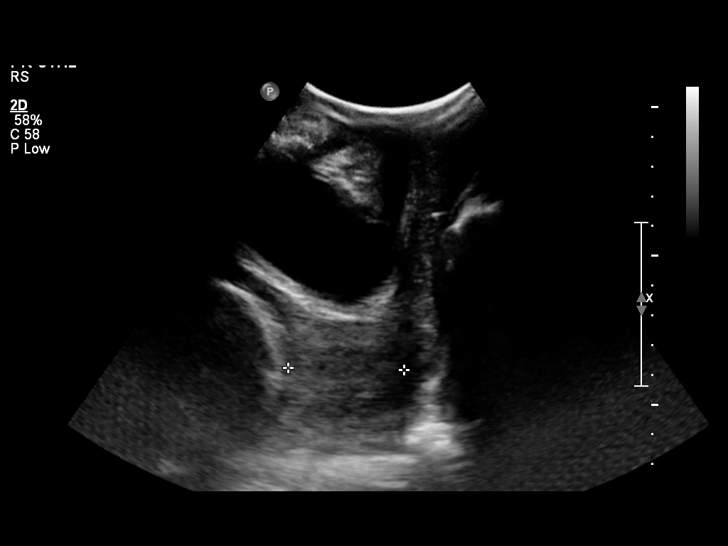

[12 of 28 positions shown; findings below may reference images not displayed]

OBSTETRICS REPORT
                      (Signed Final 04/29/2013 [DATE])

Service(s) Provided

 US OB FOLLOW UP                                       76816.1
Indications

 History of cesarean delivery, currently pregnant      654.20,
 Maternal obesity (286 lb)
 Diabetes - Gestational, A1 (diet controlled)
 Hypertension - Chronic/Pre-existing
Fetal Evaluation

 Num Of Fetuses:    1
 Fetal Heart Rate:  155                         bpm
 Cardiac Activity:  Observed
 Presentation:      Cephalic
 Placenta:          Posterior, above cervical
                    os
 P. Cord            Previously Visualized
 Insertion:

 Amniotic Fluid
 AFI FV:      Subjectively within normal limits
                                             Larg Pckt:     6.4  cm
Biometry

 BPD:       66  mm    G. Age:   26w 4d                CI:        70.12   70 - 86
                                                      FL/HC:      21.0   18.6 -

 HC:     251.4  mm    G. Age:   27w 2d       48  %    HC/AC:      1.07   1.05 -

 AC:     235.3  mm    G. Age:   27w 6d       79  %    FL/BPD:     80.2   71 - 87
 FL:      52.9  mm    G. Age:   28w 1d       79  %    FL/AC:      22.5   20 - 24

 Est. FW:    7749  gm      2 lb 8 oz     76  %
Gestational Age

 LMP:           30w 5d       Date:   09/26/12                 EDD:   07/03/13
 U/S Today:     27w 3d                                        EDD:   07/26/13
 Best:          26w 4d    Det. By:   Early Ultrasound         EDD:   08/01/13
                                     (12/27/12)
Anatomy
 Cranium:          Appears normal         Aortic Arch:      Previously seen
 Fetal Cavum:      Previously seen        Ductal Arch:      Previously seen
 Ventricles:       Appears normal         Diaphragm:        Previously seen
 Choroid Plexus:   Previously seen        Stomach:          Appears normal
 Cerebellum:       Previously seen        Abdomen:          Appears normal
 Posterior Fossa:  Previously seen        Abdominal Wall:   Previously seen
 Nuchal Fold:      Previously seen        Cord Vessels:     Previously seen
 Face:             Orbits previously      Kidneys:          Appear normal
                   seen
 Lips:             Previously seen        Bladder:          Appears normal
 Heart:            Previously seen        Spine:            Previously seen
 RVOT:             Not well visualized    Lower             Previously seen
                                          Extremities:
 LVOT:             Previously seen        Upper             Previously seen
                                          Extremities:

 Other:  Male gender previously. Heels and 5th digit previously visualized.
         Nasal bone previously visualized. Technically difficult due to maternal
         habitus and fetal position.
Targeted Anatomy

 Fetal Central Nervous System
 Lat. Ventricles:
Cervix Uterus Adnexa

 Cervical Length:   3.88      cm

 Cervix:       Normal appearance by translabial scan.
 Left Ovary:   Not visualized.
 Right Ovary:  Size(cm) L: 3.12 x W: 2.25 x H: 1.95  Volume(cc):
 Adnexa:     No abnormality visualized.
Impression

 Single living IUP with assigned GA of 26w 4d. EFW is at the
 76th percentile.
 Normal amniotic fluid volume and cervical length.

## 2014-01-06 IMAGING — US US OB FOLLOW-UP
1 series · 12 of 28 positions shown · non-contrast
Comparison: none

[Series 1: us ob follow-up · 0.22mm/px · 37 acquisitions, 12 frames shown]
[im 2/37]
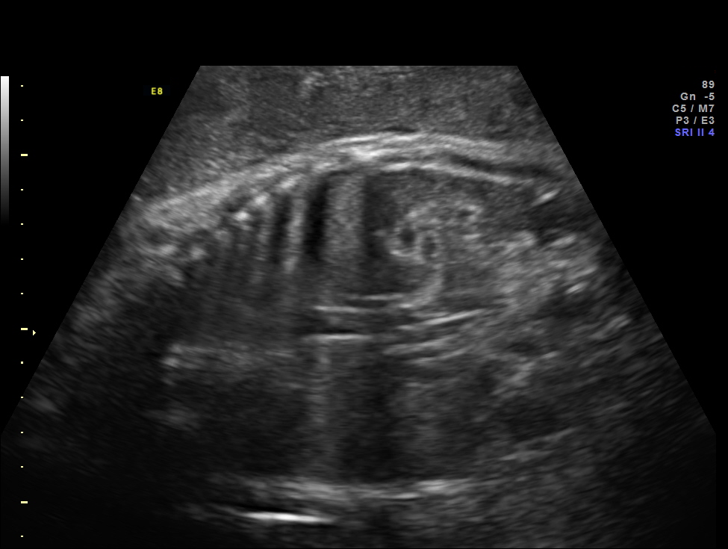
[im 5/37]
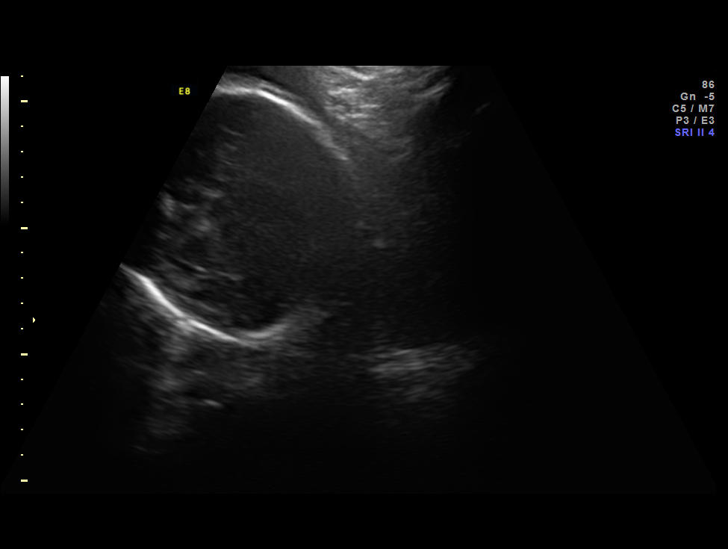
[im 7/37]
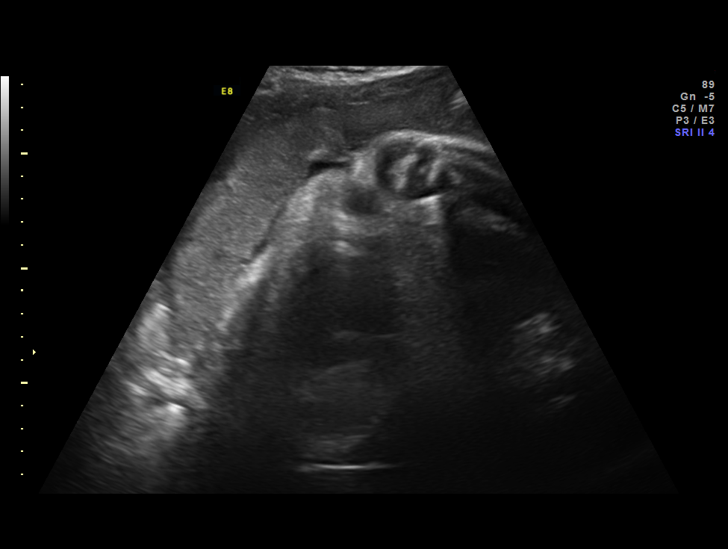
[im 11/37]
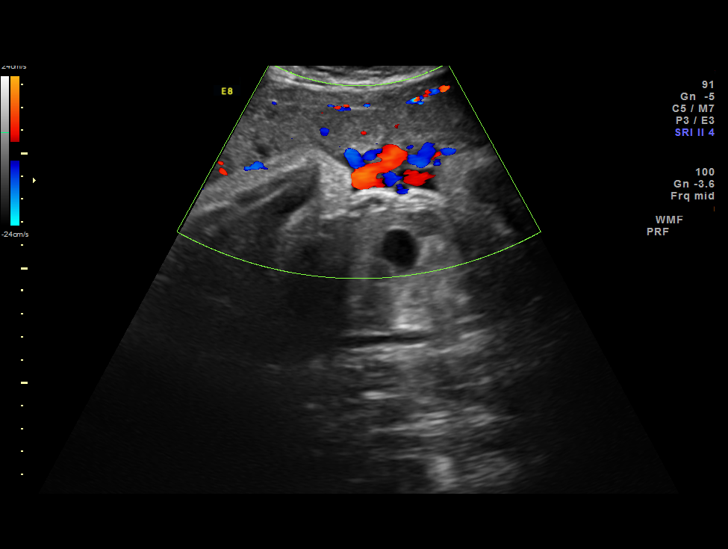
[im 14/37]
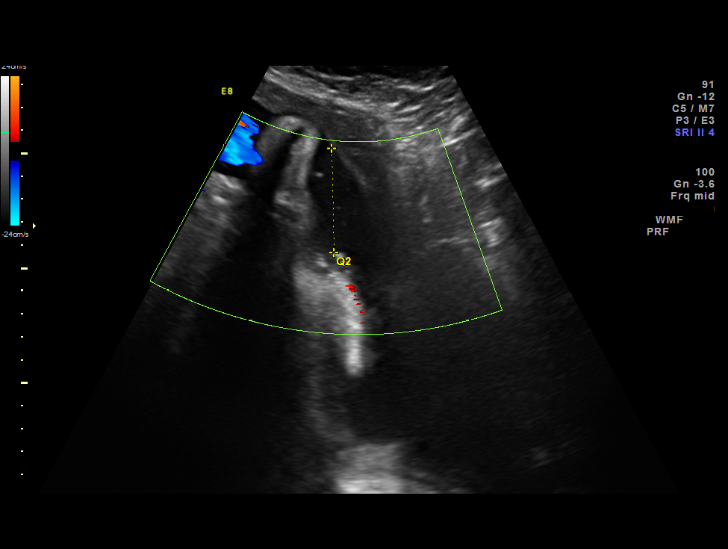
[im 17/37]
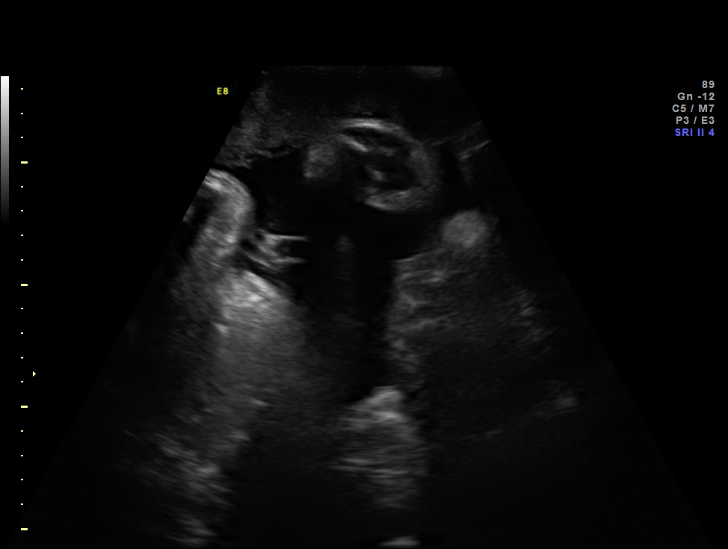
[im 21/37]
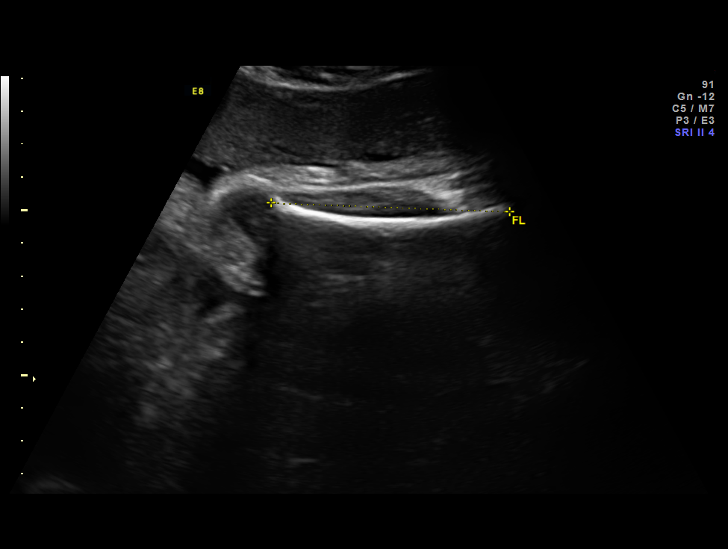
[im 23/37]
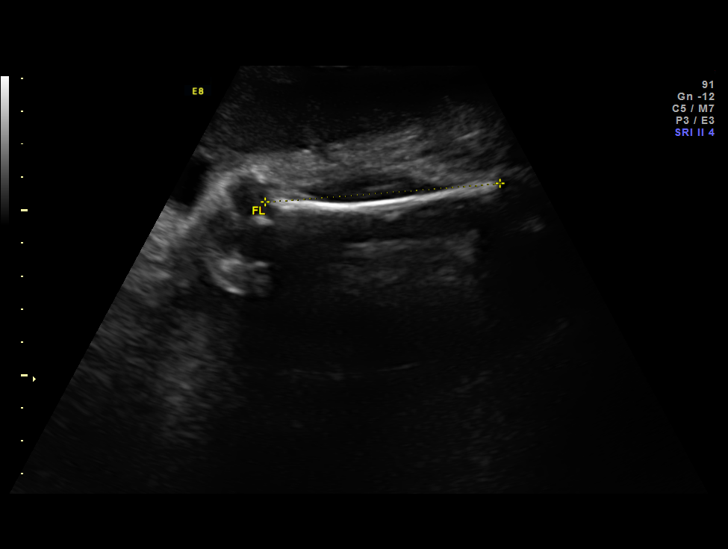
[im 26/37]
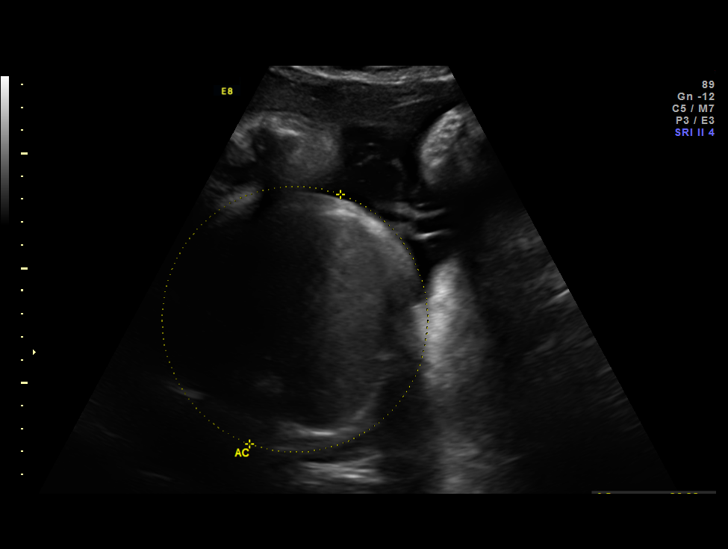
[im 30/37]
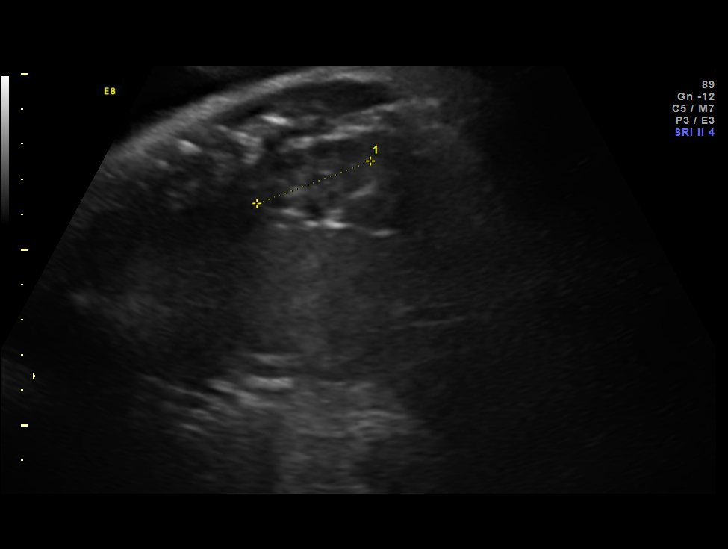
[im 33/37]
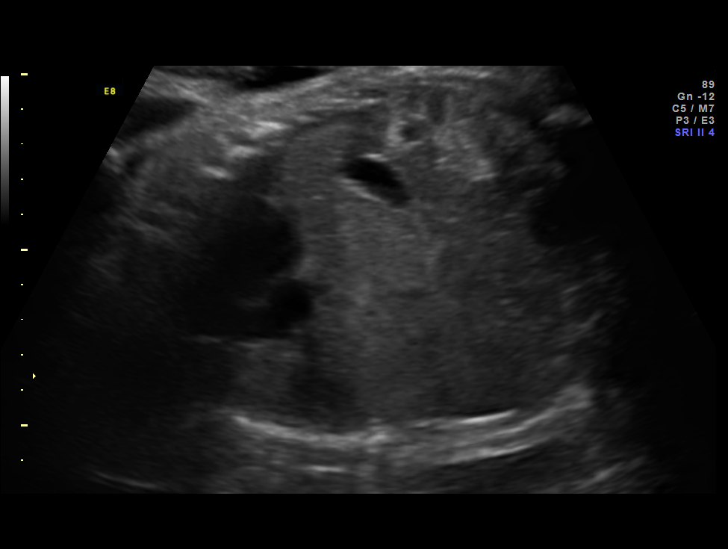
[im 35/37]
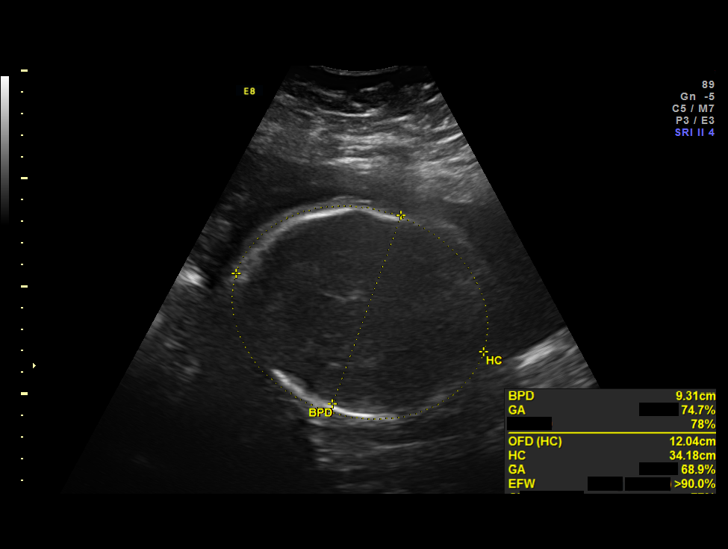

[12 of 28 positions shown; findings below may reference images not displayed]

OBSTETRICS REPORT
                      (Signed Final 07/16/2013 [DATE])

Service(s) Provided

 US OB FOLLOW UP                                       76816.1
Indications

 Size greater than dates (Large for gestational [AGE]
 Diabetes - Gestational, A2 on glyburide
 Maternal morbid obesity (282 lb)
 History of cesarean delivery, currently pregnant      654.20,
Fetal Evaluation

 Num Of Fetuses:    1
 Fetal Heart Rate:  130                          bpm
 Cardiac Activity:  Observed
 Presentation:      Cephalic
 Placenta:          Anterior, above cervical os
 P. Cord            Visualized, central
 Insertion:

 Amniotic Fluid
 AFI FV:      Subjectively within normal limits
 AFI Sum:     17.94   cm       69  %Tile     Larg Pckt:     5.2  cm
 RUQ:   3.77    cm   RLQ:    4.42   cm    LUQ:   4.55    cm   LLQ:    5.2    cm
Biophysical Evaluation

 Amniotic F.V:   Within normal limits       F. Tone:        Observed
 F. Movement:    Observed                   Score:          [DATE]
 F. Breathing:   Observed
Biometry

 BPD:       92  mm     G. Age:  37w 3d                CI:         78.4   70 - 86
 OFD:    117.3  mm                                    FL/HC:      21.1   20.9 -

 HC:       338  mm     G. Age:  38w 6d       55  %    HC/AC:      0.92   0.92 -

 AC:     366.4  mm     G. Age:  40w 4d     > 97  %    FL/BPD:     77.6   71 - 87
 FL:      71.4  mm     G. Age:  36w 4d       24  %    FL/AC:      19.5   20 - 24

 Est. FW:    8630  gm      8 lb 2 oz   > 90  %
Gestational Age
 LMP:           41w 6d        Date:  09/26/12                 EDD:   07/03/13
 U/S Today:     38w 3d                                        EDD:   07/27/13
 Best:          37w 5d     Det. By:  Early Ultrasound         EDD:   08/01/13
                                     (12/27/12)
Anatomy

 Cranium:          Previously seen        Aortic Arch:      Previously seen
 Fetal Cavum:      Previously seen        Ductal Arch:      Previously seen
 Ventricles:       Previously seen        Diaphragm:        Appears normal
 Choroid Plexus:   Previously seen        Stomach:          Appears normal, left
                                                            sided
 Cerebellum:       Previously seen        Abdomen:          Previously seen
 Posterior Fossa:  Previously seen        Abdominal Wall:   Previously seen
 Nuchal Fold:      Previously seen        Cord Vessels:     Previously seen
 Face:             Orbits previously      Kidneys:          Appear normal
                   seen
 Lips:             Previously seen        Bladder:          Appears normal
 Heart:            Previously seen        Spine:            Previously seen
 RVOT:             Not well visualized    Lower             Previously seen
                                          Extremities:
 LVOT:             Previously seen        Upper             Previously seen
                                          Extremities:

 Other:  Male gender. Heels and 5th digit previously seen.
Cervix Uterus Adnexa

 Cervix:       Not visualized (advanced GA >94wks)
Impression

 Single IUP at 37 [DATE] weeks
 A2 GDM on glyburide
 Estimated fetal weight today is > 90th %tile (8630 g), the AC
 measures > 97th %tile
 Active fetus with BPP of [DATE]
 Normal amniotic fluid volume
Recommendations

 Recommend 2x weekly NSTs with weekly AFIs
 Delivery at 39 weeks due to A2 GDM.

## 2014-01-11 IMAGING — US US FETAL BPP W/O NONSTRESS
1 series · 13 of 19 positions shown · non-contrast
Comparison: none

[Series 1: us fetal bpp w/o nonstress · non-contrast · 19 acquisitions, 13 frames shown]
[im 1/19]
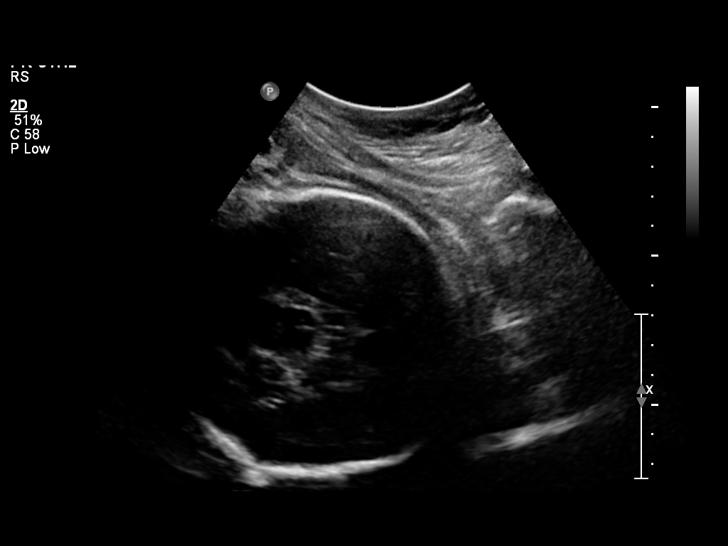
[im 3/19]
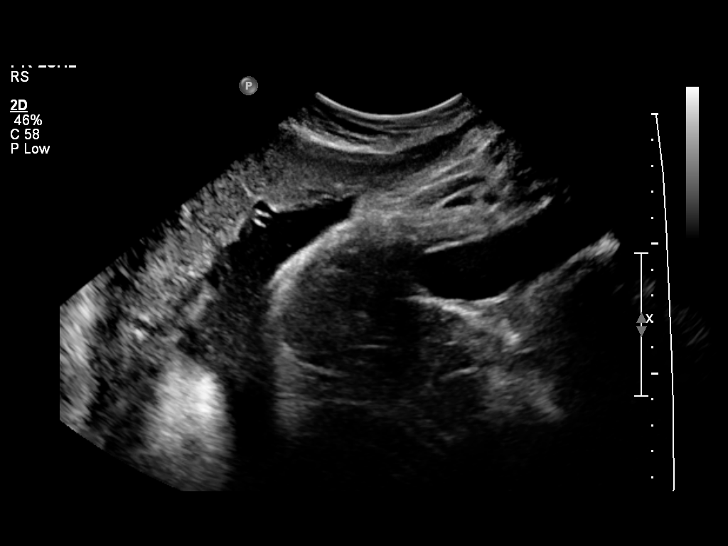
[im 4/19]
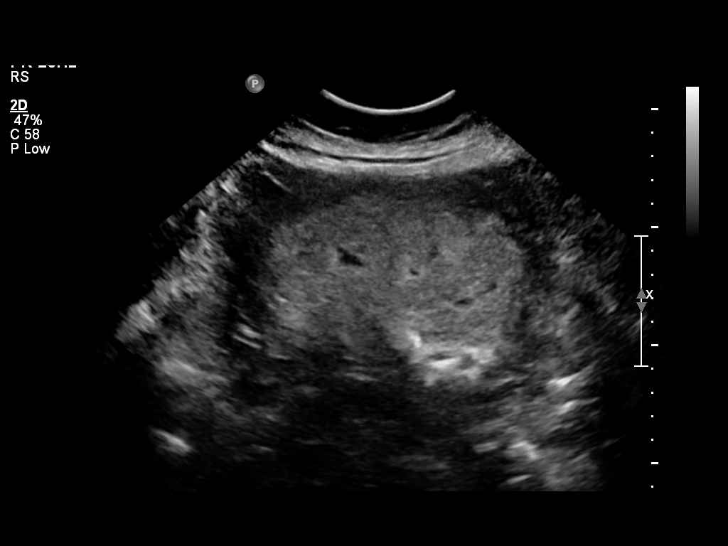
[im 6/19]
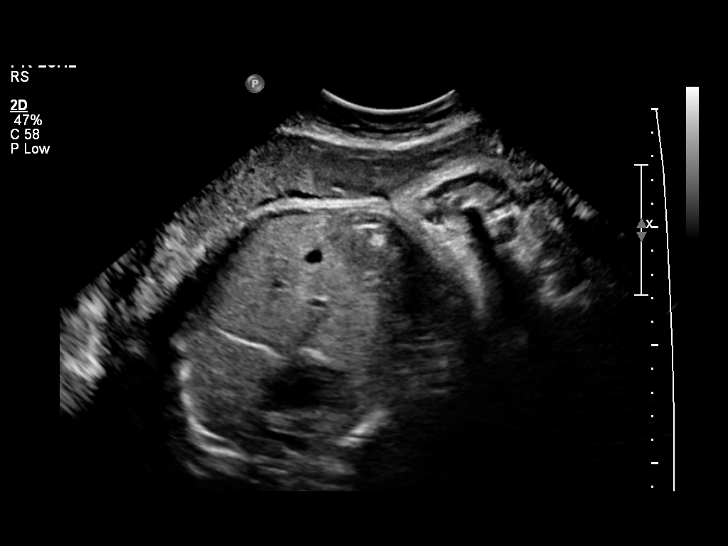
[im 7/19]
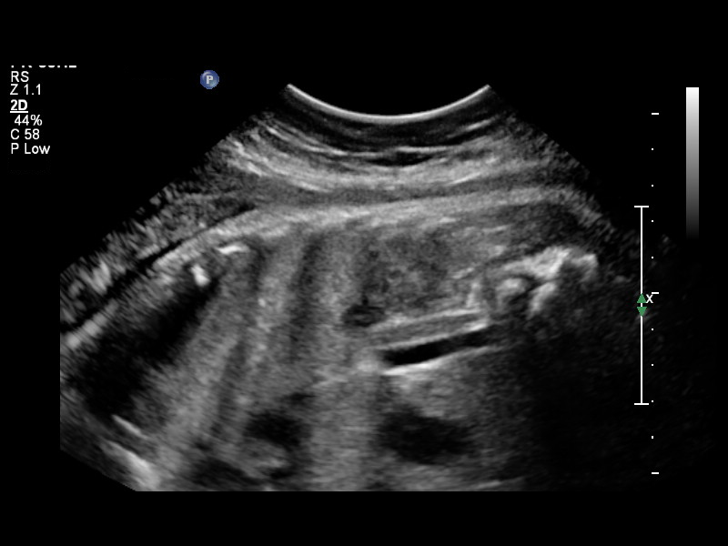
[im 9/19]
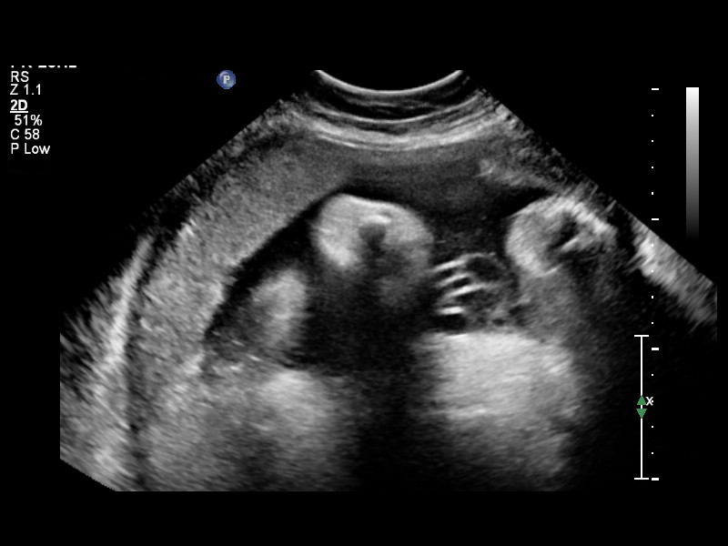
[im 10/19]
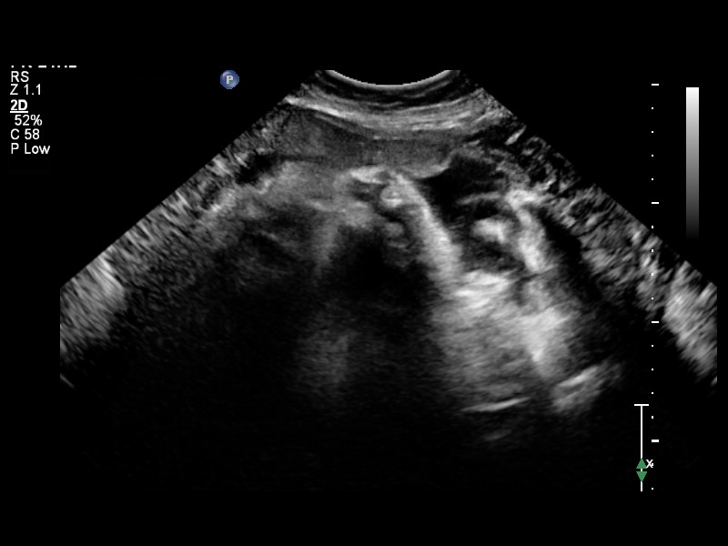
[im 11/19]
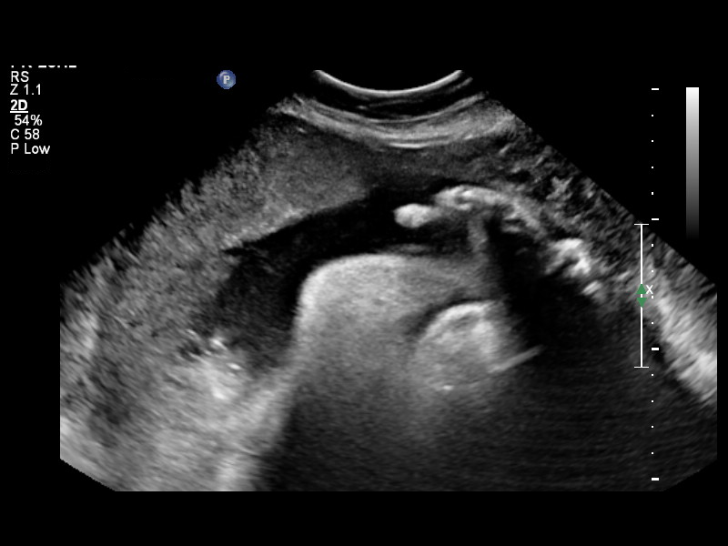
[im 13/19]
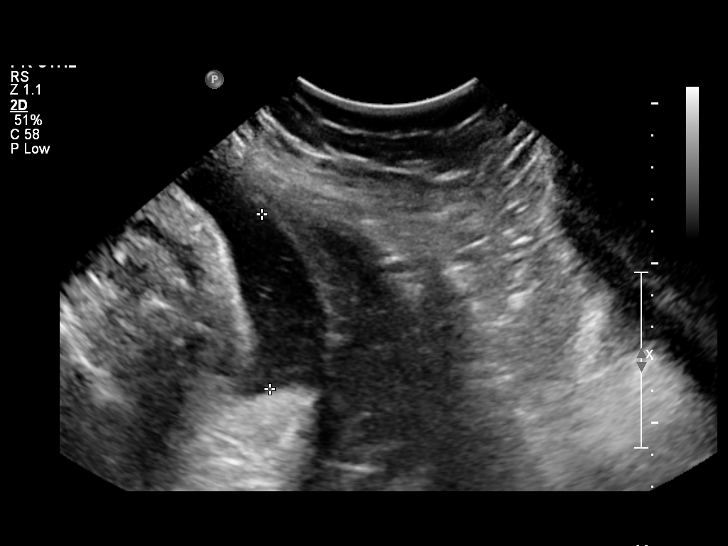
[im 14/19]
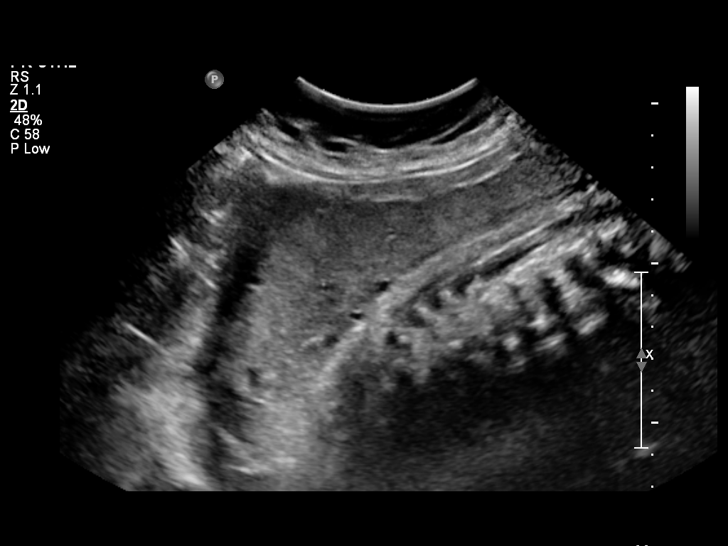
[im 16/19]
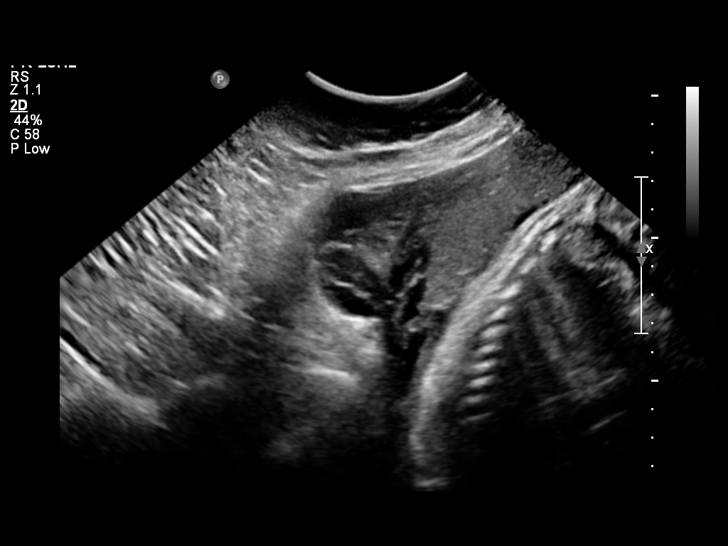
[im 17/19]
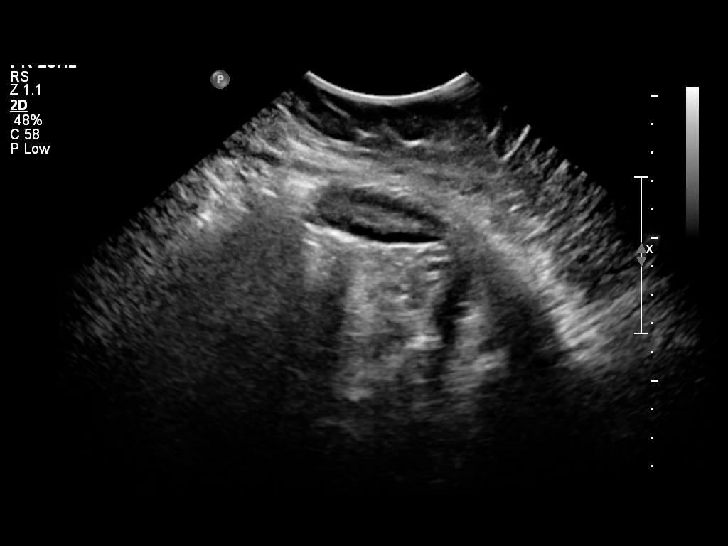
[im 19/19]
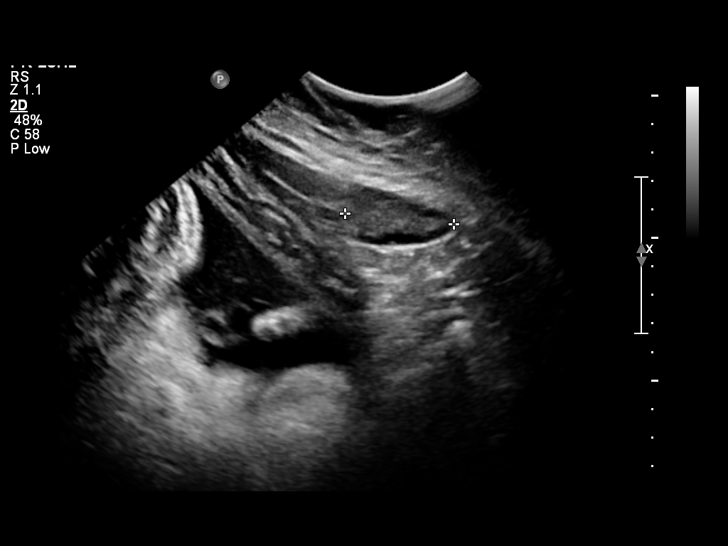

[13 of 19 positions shown; findings below may reference images not displayed]

OBSTETRICS REPORT
                      (Signed Final 07/22/2013 [DATE])

Service(s) Provided

 [HOSPITAL]                                         76815.0
Indications

 Size greater than dates (Large for gestational [AGE]
 Diabetes - Gestational, A2 on glyburide
 Maternal morbid obesity (282 lb)
 History of cesarean delivery, currently pregnant      654.20,
 Decreased fetal movement
Fetal Evaluation

 Num Of Fetuses:    1
 Fetal Heart Rate:  156                          bpm
 Cardiac Activity:  Observed
 Presentation:      Cephalic
 Placenta:          Anterior, above cervical os
 P. Cord            Previously Visualized
 Insertion:

 Amniotic Fluid
 AFI FV:      Subjectively within normal limits
 AFI Sum:     14.7    cm       57  %Tile     Larg Pckt:    7.09  cm
 RLQ:   5.46    cm   LUQ:    7.09   cm    LLQ:   2.15    cm
Biophysical Evaluation

 Amniotic F.V:   Within normal limits       F. Tone:        Observed
 F. Movement:    Observed                   Score:          [DATE]
 F. Breathing:   Observed
Gestational Age

 LMP:           42w 4d        Date:  09/26/12                 EDD:   07/03/13
 Best:          38w 3d     Det. By:  Early Ultrasound         EDD:   08/01/13
                                     (12/27/12)
Cervix Uterus Adnexa

 Cervix:       Not visualized (advanced GA >34 wks)
 Left Ovary:    Within normal limits.
 Right Ovary:   Not visualized.
 Adnexa:     No abnormality visualized.
Impression

 IUP at 38+3 weeks
 Normal amniotic fluid volume
 BPP [DATE]
Recommendations

 Continue twice weekly NSTs with weekly AFIs
 Deliver at 39 weeks

## 2014-01-15 ENCOUNTER — Ambulatory Visit (INDEPENDENT_AMBULATORY_CARE_PROVIDER_SITE_OTHER): Payer: Managed Care, Other (non HMO) | Admitting: Obstetrics & Gynecology

## 2014-01-15 ENCOUNTER — Encounter: Payer: Self-pay | Admitting: Obstetrics & Gynecology

## 2014-01-15 VITALS — BP 126/86 | HR 79 | Resp 16 | Ht 66.0 in | Wt 277.0 lb

## 2014-01-15 DIAGNOSIS — Z6841 Body Mass Index (BMI) 40.0 and over, adult: Secondary | ICD-10-CM

## 2014-01-15 DIAGNOSIS — Z01812 Encounter for preprocedural laboratory examination: Secondary | ICD-10-CM

## 2014-01-15 DIAGNOSIS — Z30017 Encounter for initial prescription of implantable subdermal contraceptive: Secondary | ICD-10-CM

## 2014-01-15 LAB — POCT URINE PREGNANCY: Preg Test, Ur: NEGATIVE

## 2014-01-15 MED ORDER — ETONOGESTREL 68 MG ~~LOC~~ IMPL
68.0000 mg | DRUG_IMPLANT | Freq: Once | SUBCUTANEOUS | Status: AC
  Administered 2014-01-15: 68 mg via SUBCUTANEOUS

## 2014-01-15 NOTE — Patient Instructions (Signed)
Etonogestrel implant What is this medicine? ETONOGESTREL (et oh noe JES trel) is a contraceptive (birth control) device. It is used to prevent pregnancy. It can be used for up to 3 years. This medicine may be used for other purposes; ask your health care provider or pharmacist if you have questions. COMMON BRAND NAME(S): Implanon, Nexplanon  What should I tell my health care provider before I take this medicine? They need to know if you have any of these conditions: -abnormal vaginal bleeding -blood vessel disease or blood clots -cancer of the breast, cervix, or liver -depression -diabetes -gallbladder disease -headaches -heart disease or recent heart attack -high blood pressure -high cholesterol -kidney disease -liver disease -renal disease -seizures -tobacco smoker -an unusual or allergic reaction to etonogestrel, other hormones, anesthetics or antiseptics, medicines, foods, dyes, or preservatives -pregnant or trying to get pregnant -breast-feeding How should I use this medicine? This device is inserted just under the skin on the inner side of your upper arm by a health care professional. Talk to your pediatrician regarding the use of this medicine in children. Special care may be needed. Overdosage: If you think you've taken too much of this medicine contact a poison control center or emergency room at once. Overdosage: If you think you have taken too much of this medicine contact a poison control center or emergency room at once. NOTE: This medicine is only for you. Do not share this medicine with others. What if I miss a dose? This does not apply. What may interact with this medicine? Do not take this medicine with any of the following medications: -amprenavir -bosentan -fosamprenavir This medicine may also interact with the following medications: -barbiturate medicines for inducing sleep or treating seizures -certain medicines for fungal infections like ketoconazole and  itraconazole -griseofulvin -medicines to treat seizures like carbamazepine, felbamate, oxcarbazepine, phenytoin, topiramate -modafinil -phenylbutazone -rifampin -some medicines to treat HIV infection like atazanavir, indinavir, lopinavir, nelfinavir, tipranavir, ritonavir -St. John's wort This list may not describe all possible interactions. Give your health care provider a list of all the medicines, herbs, non-prescription drugs, or dietary supplements you use. Also tell them if you smoke, drink alcohol, or use illegal drugs. Some items may interact with your medicine. What should I watch for while using this medicine? This product does not protect you against HIV infection (AIDS) or other sexually transmitted diseases. You should be able to feel the implant by pressing your fingertips over the skin where it was inserted. Tell your doctor if you cannot feel the implant. What side effects may I notice from receiving this medicine? Side effects that you should report to your doctor or health care professional as soon as possible: -allergic reactions like skin rash, itching or hives, swelling of the face, lips, or tongue -breast lumps -changes in vision -confusion, trouble speaking or understanding -dark urine -depressed mood -general ill feeling or flu-like symptoms -light-colored stools -loss of appetite, nausea -right upper belly pain -severe headaches -severe pain, swelling, or tenderness in the abdomen -shortness of breath, chest pain, swelling in a leg -signs of pregnancy -sudden numbness or weakness of the face, arm or leg -trouble walking, dizziness, loss of balance or coordination -unusual vaginal bleeding, discharge -unusually weak or tired -yellowing of the eyes or skin Side effects that usually do not require medical attention (Report these to your doctor or health care professional if they continue or are bothersome.): -acne -breast pain -changes in  weight -cough -fever or chills -headache -irregular menstrual bleeding -itching, burning,   and vaginal discharge -pain or difficulty passing urine -sore throat This list may not describe all possible side effects. Call your doctor for medical advice about side effects. You may report side effects to FDA at 1-800-FDA-1088. Where should I keep my medicine? This drug is given in a hospital or clinic and will not be stored at home. NOTE: This sheet is a summary. It may not cover all possible information. If you have questions about this medicine, talk to your doctor, pharmacist, or health care provider.  2014, [employer]/Gold Standard. (2012-03-19 15:37:45)  

## 2014-01-15 NOTE — Addendum Note (Signed)
Addended by: Arne ClevelandHUTCHINSON, Kyna Blahnik J on: 01/15/2014 10:23 AM   Modules accepted: Orders

## 2014-01-15 NOTE — Progress Notes (Signed)
   Subjective:    Patient ID: [name], female    DOB: [birth year], 33 y.o.   MRN: [id number]  HPI  [name] is here for her second attempt at getting a Nexplanon. The last time she came here for Nexplanon, she discovered that she was pregnant. She understands that irregular bleeding is common with this form of contraception. She currently uses condoms.  Review of Systems She is breast feeding her 15 month old.    Objective:   Physical Exam  UPT was negative. Consent was signed. Time out procedure was done. Her left arm was prepped with betadine and infiltrated with 3 cc of 1% lidocaine. After adequate anesthesia was assured, the Nexplanon device was placed according to standard of care. Her arm was hemostatic and was bandaged. She tolerated the procedure well.       Assessment & Plan:   Contraception- Nexplanon Back up for a week RTC 3 months

## 2014-07-28 ENCOUNTER — Encounter: Payer: Self-pay | Admitting: Obstetrics & Gynecology

## 2014-08-27 DIAGNOSIS — G4733 Obstructive sleep apnea (adult) (pediatric): Secondary | ICD-10-CM | POA: Insufficient documentation

## 2015-04-02 DIAGNOSIS — D72829 Elevated white blood cell count, unspecified: Secondary | ICD-10-CM | POA: Insufficient documentation

## 2015-04-07 DIAGNOSIS — F339 Major depressive disorder, recurrent, unspecified: Secondary | ICD-10-CM | POA: Insufficient documentation

## 2017-01-18 ENCOUNTER — Ambulatory Visit: Payer: Managed Care, Other (non HMO) | Admitting: Obstetrics & Gynecology

## 2017-01-30 ENCOUNTER — Ambulatory Visit: Payer: Managed Care, Other (non HMO) | Admitting: Obstetrics & Gynecology

## 2017-02-01 ENCOUNTER — Ambulatory Visit: Payer: Managed Care, Other (non HMO) | Admitting: Obstetrics & Gynecology

## 2017-02-06 ENCOUNTER — Encounter: Payer: Self-pay | Admitting: Obstetrics & Gynecology

## 2017-02-06 ENCOUNTER — Ambulatory Visit (INDEPENDENT_AMBULATORY_CARE_PROVIDER_SITE_OTHER): Payer: BLUE CROSS/BLUE SHIELD | Admitting: Obstetrics & Gynecology

## 2017-02-06 VITALS — BP 119/74 | HR 96 | Ht 66.0 in | Wt 301.0 lb

## 2017-02-06 DIAGNOSIS — Z1151 Encounter for screening for human papillomavirus (HPV): Secondary | ICD-10-CM

## 2017-02-06 DIAGNOSIS — Z124 Encounter for screening for malignant neoplasm of cervix: Secondary | ICD-10-CM | POA: Diagnosis not present

## 2017-02-06 DIAGNOSIS — Z0000 Encounter for general adult medical examination without abnormal findings: Secondary | ICD-10-CM

## 2017-02-06 DIAGNOSIS — Z30017 Encounter for initial prescription of implantable subdermal contraceptive: Secondary | ICD-10-CM

## 2017-02-06 DIAGNOSIS — Z3046 Encounter for surveillance of implantable subdermal contraceptive: Secondary | ICD-10-CM | POA: Diagnosis not present

## 2017-02-06 DIAGNOSIS — Z01419 Encounter for gynecological examination (general) (routine) without abnormal findings: Secondary | ICD-10-CM

## 2017-02-06 DIAGNOSIS — Z113 Encounter for screening for infections with a predominantly sexual mode of transmission: Secondary | ICD-10-CM

## 2017-02-06 MED ORDER — ETONOGESTREL 68 MG ~~LOC~~ IMPL
68.0000 mg | DRUG_IMPLANT | Freq: Once | SUBCUTANEOUS | Status: AC
  Administered 2017-02-06: 68 mg via SUBCUTANEOUS

## 2017-02-06 NOTE — Addendum Note (Signed)
Addended by: Kathie DikeSOLA, Esdras Delair J on: 02/06/2017 03:37 PM   Modules accepted: Orders

## 2017-02-06 NOTE — Addendum Note (Signed)
Addended by: Kathie DikeSOLA, Caymen Dubray J on: 02/06/2017 03:42 PM   Modules accepted: Orders

## 2017-02-06 NOTE — Progress Notes (Signed)
   Subjective:    Patient ID: [name], female    DOB: 1981/03/01, 36 y.o.   MRN: [id number]  HPI 36 yo S AA P2 (6 and 35 yo kids) here to get her newly expiring Nexplanon removed and replaced. She has only had 3 periods in the last years. She has a new partner and would like STI testing.  Her fam med doc checks her for DM.   Review of Systems     Objective:   Physical Exam Pleasant BFNAD Breathing, conversing, and ambulating normally Consent was signed and time out was done. Her left arm was prepped with betadine after establishing the position of the Nexplanon. The area was infiltrated with 2 cc of 1% lidocaine. A small incision was made and the intact rod was easily removed. I then put in a new Nexplanon according to standard of care insertion. A steristrip was placed and her arm was noted to be hemostatic. It was bandaged.  She tolerated the procedure well.      Assessment & Plan:  Contraception- Nexplanon Preventative care- STI testing and pap smear

## 2017-02-07 ENCOUNTER — Telehealth: Payer: Self-pay

## 2017-02-07 LAB — RPR

## 2017-02-07 LAB — HEPATITIS B SURFACE ANTIGEN: HEP B S AG: NEGATIVE

## 2017-02-07 LAB — HEPATITIS C ANTIBODY: HCV Ab: NEGATIVE

## 2017-02-07 LAB — HIV ANTIBODY (ROUTINE TESTING W REFLEX): HIV: NONREACTIVE

## 2017-02-07 NOTE — Telephone Encounter (Signed)
Spoke with pt about her blood work that checked for STD's  and let her know that it was all negative. Pt expressed understanding.

## 2017-02-09 LAB — CYTOLOGY - PAP
Bacterial vaginitis: POSITIVE — AB
CANDIDA VAGINITIS: NEGATIVE
DIAGNOSIS: NEGATIVE
HPV: DETECTED — AB

## 2017-02-13 ENCOUNTER — Telehealth: Payer: Self-pay

## 2017-02-13 MED ORDER — METRONIDAZOLE 500 MG PO TABS: 500.0000 mg | ORAL_TABLET | Freq: Two times a day (BID) | ORAL | 0 refills | Status: DC

## 2017-02-13 NOTE — Telephone Encounter (Signed)
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 HPV was explained to pt however, she already knew what it was considering she's been positive for it before. Pt is aware to come back in one year for a repeat pap with cotesting. Pt is also aware of positive BV results and she is aware that medication has been sent to her pharmacy. Pt was told not to drink alcohol while taking medication. Pt expressed understanding.

## 2017-07-04 ENCOUNTER — Other Ambulatory Visit (INDEPENDENT_AMBULATORY_CARE_PROVIDER_SITE_OTHER): Payer: BLUE CROSS/BLUE SHIELD

## 2017-07-04 DIAGNOSIS — Z113 Encounter for screening for infections with a predominantly sexual mode of transmission: Secondary | ICD-10-CM

## 2017-07-04 DIAGNOSIS — N898 Other specified noninflammatory disorders of vagina: Secondary | ICD-10-CM | POA: Diagnosis not present

## 2017-07-04 NOTE — Progress Notes (Signed)
Pt here with c/o's vaginal odor that smells like ammonia and vaginal dryness. She denies urinary pain or frequency..  Urine culture sent and Aptima swab sent.  Will call patient with results.

## 2017-07-06 LAB — CULTURE, URINE COMPREHENSIVE
MICRO NUMBER:: 81124371
SPECIMEN QUALITY: ADEQUATE

## 2017-07-06 LAB — URINE CYTOLOGY ANCILLARY ONLY
Bacterial vaginitis: NEGATIVE
Candida vaginitis: POSITIVE — AB
Trichomonas: NEGATIVE

## 2017-07-07 ENCOUNTER — Telehealth: Payer: Self-pay

## 2017-07-07 ENCOUNTER — Other Ambulatory Visit: Payer: Self-pay | Admitting: *Deleted

## 2017-07-07 DIAGNOSIS — N390 Urinary tract infection, site not specified: Secondary | ICD-10-CM

## 2017-07-07 DIAGNOSIS — B379 Candidiasis, unspecified: Secondary | ICD-10-CM

## 2017-07-07 DIAGNOSIS — B373 Candidiasis of vulva and vagina: Secondary | ICD-10-CM

## 2017-07-07 DIAGNOSIS — B3731 Acute candidiasis of vulva and vagina: Secondary | ICD-10-CM

## 2017-07-07 MED ORDER — FLUCONAZOLE 150 MG PO TABS: 150.0000 mg | ORAL_TABLET | Freq: Once | ORAL | 0 refills | Status: AC

## 2017-07-07 MED ORDER — FLUCONAZOLE 150 MG PO TABS: ORAL_TABLET | ORAL | 0 refills | Status: DC

## 2017-07-07 MED ORDER — SULFAMETHOXAZOLE-TRIMETHOPRIM 800-160 MG PO TABS: 1.0000 | ORAL_TABLET | Freq: Two times a day (BID) | ORAL | 0 refills | Status: DC

## 2017-07-07 NOTE — Telephone Encounter (Signed)
Spoke with pt and she is aware that her urine culture came back positive and that her aptima came back positive for a yeast infection. She is aware that the medications for both of these have been sent to her pharmacy.

## 2017-07-07 NOTE — Progress Notes (Signed)
Attempted to call patient several times and phone lines were down.  RX for Diflucan were sent to [facility] on [address] for yeast.  Aptima was positive for yeast.

## 2018-03-30 DIAGNOSIS — F419 Anxiety disorder, unspecified: Secondary | ICD-10-CM | POA: Insufficient documentation

## 2018-03-30 DIAGNOSIS — F32A Depression, unspecified: Secondary | ICD-10-CM | POA: Insufficient documentation

## 2018-08-02 ENCOUNTER — Encounter (HOSPITAL_COMMUNITY): Payer: Self-pay

## 2018-08-02 ENCOUNTER — Other Ambulatory Visit: Payer: Self-pay

## 2018-08-02 ENCOUNTER — Emergency Department (HOSPITAL_COMMUNITY): Payer: Self-pay

## 2018-08-02 ENCOUNTER — Emergency Department (HOSPITAL_COMMUNITY)
Admission: EM | Admit: 2018-08-02 | Discharge: 2018-08-02 | Disposition: A | Discharge status: Home / Self Care | Payer: Self-pay | Attending: Emergency Medicine | Admitting: Emergency Medicine

## 2018-08-02 DIAGNOSIS — Y999 Unspecified external cause status: Secondary | ICD-10-CM | POA: Insufficient documentation

## 2018-08-02 DIAGNOSIS — Y92410 Unspecified street and highway as the place of occurrence of the external cause: Secondary | ICD-10-CM | POA: Insufficient documentation

## 2018-08-02 DIAGNOSIS — I10 Essential (primary) hypertension: Secondary | ICD-10-CM | POA: Insufficient documentation

## 2018-08-02 DIAGNOSIS — Z79899 Other long term (current) drug therapy: Secondary | ICD-10-CM | POA: Insufficient documentation

## 2018-08-02 DIAGNOSIS — T148XXA Other injury of unspecified body region, initial encounter: Secondary | ICD-10-CM

## 2018-08-02 DIAGNOSIS — S51842A Puncture wound with foreign body of left forearm, initial encounter: Secondary | ICD-10-CM | POA: Insufficient documentation

## 2018-08-02 DIAGNOSIS — M25552 Pain in left hip: Secondary | ICD-10-CM | POA: Insufficient documentation

## 2018-08-02 DIAGNOSIS — S20211A Contusion of right front wall of thorax, initial encounter: Secondary | ICD-10-CM | POA: Insufficient documentation

## 2018-08-02 DIAGNOSIS — Y9389 Activity, other specified: Secondary | ICD-10-CM | POA: Insufficient documentation

## 2018-08-02 DIAGNOSIS — E119 Type 2 diabetes mellitus without complications: Secondary | ICD-10-CM | POA: Insufficient documentation

## 2018-08-02 DIAGNOSIS — R0789 Other chest pain: Secondary | ICD-10-CM

## 2018-08-02 LAB — URINALYSIS, ROUTINE W REFLEX MICROSCOPIC
Bilirubin Urine: NEGATIVE
Glucose, UA: NEGATIVE mg/dL
Ketones, ur: NEGATIVE mg/dL
Nitrite: NEGATIVE
Protein, ur: NEGATIVE mg/dL
SPECIFIC GRAVITY, URINE: 1.009 (ref 1.005–1.030)
pH: 6 (ref 5.0–8.0)

## 2018-08-02 LAB — POC URINE PREG, ED: PREG TEST UR: NEGATIVE

## 2018-08-02 MED ORDER — METHOCARBAMOL 500 MG PO TABS: 500.0000 mg | ORAL_TABLET | Freq: Every evening | ORAL | 0 refills | Status: DC | PRN

## 2018-08-02 MED ORDER — NAPROXEN 500 MG PO TABS
500.0000 mg | ORAL_TABLET | Freq: Once | ORAL | Status: AC
  Administered 2018-08-02: 500 mg via ORAL
  Filled 2018-08-02: qty 1

## 2018-08-02 MED ORDER — METHOCARBAMOL 500 MG PO TABS
750.0000 mg | ORAL_TABLET | Freq: Once | ORAL | Status: AC
  Administered 2018-08-02: 750 mg via ORAL
  Filled 2018-08-02: qty 2

## 2018-08-02 MED ORDER — NAPROXEN 500 MG PO TABS: 500.0000 mg | ORAL_TABLET | Freq: Two times a day (BID) | ORAL | 0 refills | Status: DC
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
## 2018-08-02 NOTE — Discharge Instructions (Addendum)
Take naproxen 2 times a day with meals.  Do not take other anti-inflammatories at the same time (Advil, Motrin, ibuprofen, Aleve). You may supplement with Tylenol if you need further pain control. Use robaxin as needed for muscle stiffness or soreness.  Have caution, this may make you tired or groggy.  Do not drive or operate heavy machinery while taking this medicine. Use ice packs, especially on your leg to help with pain and swelling. You may try heat to help relax tense/sore muscles.  Use crutches as needed for walking/comfort. You will likely have continued muscle stiffness and soreness over the next couple days.  Follow-up with primary care in 1 week if your symptoms are not improving. Return to the emergency room if you develop vision changes, vomiting, slurred speech, numbness, loss of bowel or bladder control, or any new or worsening symptoms.

## 2019-12-12 ENCOUNTER — Encounter: Payer: Self-pay | Admitting: Obstetrics & Gynecology

## 2019-12-12 ENCOUNTER — Ambulatory Visit (INDEPENDENT_AMBULATORY_CARE_PROVIDER_SITE_OTHER): Payer: Managed Care, Other (non HMO) | Admitting: Obstetrics & Gynecology

## 2019-12-12 ENCOUNTER — Other Ambulatory Visit: Payer: Self-pay

## 2019-12-12 VITALS — BP 132/72 | HR 100 | Resp 16 | Ht 67.0 in | Wt 306.0 lb

## 2019-12-12 DIAGNOSIS — N939 Abnormal uterine and vaginal bleeding, unspecified: Secondary | ICD-10-CM | POA: Insufficient documentation

## 2019-12-12 DIAGNOSIS — Z01411 Encounter for gynecological examination (general) (routine) with abnormal findings: Secondary | ICD-10-CM

## 2019-12-12 DIAGNOSIS — I10 Essential (primary) hypertension: Secondary | ICD-10-CM | POA: Insufficient documentation

## 2019-12-12 DIAGNOSIS — N898 Other specified noninflammatory disorders of vagina: Secondary | ICD-10-CM | POA: Diagnosis not present

## 2019-12-12 DIAGNOSIS — Z1151 Encounter for screening for human papillomavirus (HPV): Secondary | ICD-10-CM | POA: Diagnosis not present

## 2019-12-12 DIAGNOSIS — Z01419 Encounter for gynecological examination (general) (routine) without abnormal findings: Secondary | ICD-10-CM

## 2019-12-12 DIAGNOSIS — E119 Type 2 diabetes mellitus without complications: Secondary | ICD-10-CM | POA: Insufficient documentation

## 2019-12-12 DIAGNOSIS — Z6841 Body Mass Index (BMI) 40.0 and over, adult: Secondary | ICD-10-CM

## 2019-12-12 DIAGNOSIS — Z975 Presence of (intrauterine) contraceptive device: Secondary | ICD-10-CM | POA: Insufficient documentation

## 2019-12-12 DIAGNOSIS — Z124 Encounter for screening for malignant neoplasm of cervix: Secondary | ICD-10-CM

## 2019-12-12 DIAGNOSIS — Z113 Encounter for screening for infections with a predominantly sexual mode of transmission: Secondary | ICD-10-CM | POA: Diagnosis not present

## 2019-12-12 NOTE — Progress Notes (Signed)
GYNECOLOGY ANNUAL PREVENTATIVE CARE ENCOUNTER NOTE  History:     [name] is a 39 y.o. G16P2002 female here for a routine annual gynecologic exam.  Current complaints: episode of heavy bleeding for several days in 06/2019, no bleeding since. On Nexplanon, usually has light menstrual periods but can skip months.  She is worried that something is wrong.   Denies current abnormal vaginal bleeding, discharge, pelvic pain, problems with intercourse or other gynecologic concerns.    Gynecologic History Patient's last menstrual period was 07/14/2019. Contraception: Nexplanon Last Pap: 02/06/2017. Results were: normal with positive HPV  Obstetric History OB History  Gravida Para Term Preterm AB Living  2 2 2  0 0 2  SAB TAB Ectopic Multiple Live Births  0 0 0 0 2    # Outcome Date GA Lbr Len/2nd Weight Sex Delivery Anes PTL Lv  2 Term 07/26/13 [redacted]w[redacted]d 07:43 / 04:33 8 lb 4.6 oz (3.76 kg) M Vag-Spont EPI, Local  LIV     Birth Comments: caput  1 Term 01/14/11 [redacted]w[redacted]d  8 lb 8 oz (3.856 kg) M CS-Unspec   LIV     Birth Comments: Prolonged rupture; fetal distress    Past Medical History:  Diagnosis Date  . Allergy   . Anxiety   . Deafness in right ear   . Depression   . Diabetes mellitus without complication ([place])   . Gestational diabetes 03/06/2013   glyburide  . Hernia   . Hypertension   . Morbid obesity ([place])   . Plantar fasciitis   . PONV (postoperative nausea and vomiting)   . Sleep apnea     Past Surgical History:  Procedure Laterality Date  . ABDOMINAL HERNIA REPAIR    . CESAREAN SECTION      Current Outpatient Medications on File Prior to Visit  Medication Sig Dispense Refill  . cetirizine (ZYRTEC) 10 MG tablet Take by mouth.    . docusate sodium (COLACE) 100 MG capsule Take 100 mg by mouth 2 (two) times daily.    Marland Kitchen etonogestrel (NEXPLANON) 68 MG IMPL implant Inject into the skin.    Marland Kitchen gabapentin (NEURONTIN) 300 MG capsule   1  . hydrochlorothiazide (HYDRODIURIL)  25 MG tablet Take by mouth.    Marland Kitchen ibuprofen (ADVIL,MOTRIN) 600 MG tablet Take 1 tablet (600 mg total) by mouth every 6 (six) hours. 30 tablet 0  . lamoTRIgine (LAMICTAL) 100 MG tablet   0  . losartan (COZAAR) 50 MG tablet Take by mouth.    . oxyCODONE-acetaminophen (PERCOCET/ROXICET) 5-325 MG per tablet Take by mouth every 4 (four) hours as needed for severe pain.    . Prenatal Vit-Fe Fumarate-FA (PRENATAL MULTIVITAMIN) TABS Take 1 tablet by mouth daily at 12 noon.    Marland Kitchen albuterol (PROVENTIL HFA;VENTOLIN HFA) 108 (90 BASE) MCG/ACT inhaler Inhale 2 puffs into the lungs every 6 (six) hours as needed for wheezing.    . busPIRone (BUSPAR) 7.5 MG tablet Take by mouth.    . escitalopram (LEXAPRO) 10 MG tablet Take by mouth.     No current facility-administered medications on file prior to visit.    No Known Allergies  Social History:  reports that she has never smoked. She has never used smokeless tobacco. She reports current alcohol use. She reports that she does not use drugs.  Family History  Problem Relation Age of Onset  . Cancer Maternal Grandmother        colon  . Dementia Maternal Grandfather   . Hypothyroidism  Mother   . Hypertension Mother    The following portions of the patient's history were reviewed and updated as appropriate: allergies, current medications, past family history, past medical history, past social history, past surgical history and problem list.  Review of Systems Pertinent items noted in HPI and remainder of comprehensive ROS otherwise negative.  Physical Exam:  BP 132/72   Pulse 100   Resp 16   Ht 5\' 7"  (1.702 m)   Wt (!) 306 lb (138.8 kg)   LMP 07/14/2019   BMI 47.93 kg/m  CONSTITUTIONAL: Well-developed, well-nourished female in no acute distress.  HENT:  Normocephalic, atraumatic, External right and left ear normal. Oropharynx is clear and moist EYES: Conjunctivae and EOM are normal. Pupils are equal, round, and reactive to light. No scleral icterus.    NECK: Normal range of motion, supple, no masses.  Normal thyroid.  SKIN: Skin is warm and dry. No rash noted. Not diaphoretic. No erythema. No pallor. MUSCULOSKELETAL: Normal range of motion. No tenderness.  No cyanosis, clubbing, or edema.   NEUROLOGIC: Alert and oriented to person, place, and time. Normal reflexes, muscle tone coordination. No cranial nerve deficit noted. PSYCHIATRIC: Normal mood and affect. Normal behavior. Normal judgment and thought content. CARDIOVASCULAR: Normal heart rate noted, regular rhythm RESPIRATORY: Clear to auscultation bilaterally. Effort and breath sounds normal, no problems with respiration noted. BREASTS: Symmetric in size. No masses, tenderness, skin changes, nipple drainage, or lymphadenopathy bilaterally.  Performed in the presence of a chaperone. ABDOMEN: Soft, obese, no distention appreciated.  No tenderness, rebound or guarding.  PELVIC: Normal appearing external genitalia and urethral meatus; normal appearing vaginal mucosa and cervix.  Normal appearing discharge.  Pap smear obtained.  Unable to palpate uterus or adnexa secondary to habitus.  Performed in the presence of a chaperone.   Assessment and Plan:    1. Abnormal uterine bleeding (AUB) Could be heavy breakthrough bleeding on Nexplanon, but will evaluate for other etiologies.  Will follow up results and manage accordingly. - CBC - DHEA-sulfate - Follicle stimulating hormone - TSH - Prolactin - B-HCG Quant - Testosterone - Testosterone, free - 07/16/2019 PELVIC COMPLETE WITH TRANSVAGINAL; Future  2. Nexplanon in place Due for removal in 01/2020. Will discuss alternative for IUD then (if AUB still present; and this also gives more endometrial protection).  3. Morbid obesity with BMI of 45.0-49.9, adult (HCC) Followed by PCP. Also has DM and HTN all managed by PCP.   4. Well woman exam with routine gynecological exam - Hepatitis B surface antigen - Hepatitis C antibody - HIV Antibody (routine  testing w rflx) - RPR - Cytology - PAP - Cervicovaginal ancillary only( [place]) Will follow up results of pap smear and results and manage accordingly. Routine preventative health maintenance measures emphasized. Please refer to After Visit Summary for other counseling recommendations.       02-19-1984, MD, FACOG Obstetrician & Gynecologist, [facility] for [facility]

## 2019-12-12 NOTE — Patient Instructions (Signed)
Preventive Care 21-39 Years Old, Female Preventive care refers to visits with your health care provider and lifestyle choices that can promote health and wellness. This includes:  A yearly physical exam. This may also be called an annual well check.  Regular dental visits and eye exams.  Immunizations.  Screening for certain conditions.  Healthy lifestyle choices, such as eating a healthy diet, getting regular exercise, not using drugs or products that contain nicotine and tobacco, and limiting alcohol use. What can I expect for my preventive care visit? Physical exam Your health care provider will check your:  Height and weight. This may be used to calculate body mass index (BMI), which tells if you are at a healthy weight.  Heart rate and blood pressure.  Skin for abnormal spots. Counseling Your health care provider may ask you questions about your:  Alcohol, tobacco, and drug use.  Emotional well-being.  Home and relationship well-being.  Sexual activity.  Eating habits.  Work and work environment.  Method of birth control.  Menstrual cycle.  Pregnancy history. What immunizations do I need?  Influenza (flu) vaccine  This is recommended every year. Tetanus, diphtheria, and pertussis (Tdap) vaccine  You may need a Td booster every 10 years. Varicella (chickenpox) vaccine  You may need this if you have not been vaccinated. Human papillomavirus (HPV) vaccine  If recommended by your health care provider, you may need three doses over 6 months. Measles, mumps, and rubella (MMR) vaccine  You may need at least one dose of MMR. You may also need a second dose. Meningococcal conjugate (MenACWY) vaccine  One dose is recommended if you are age 19-21 years and a first-year college student living in a residence hall, or if you have one of several medical conditions. You may also need additional booster doses. Pneumococcal conjugate (PCV13) vaccine  You may need  this if you have certain conditions and were not previously vaccinated. Pneumococcal polysaccharide (PPSV23) vaccine  You may need one or two doses if you smoke cigarettes or if you have certain conditions. Hepatitis A vaccine  You may need this if you have certain conditions or if you travel or work in places where you may be exposed to hepatitis A. Hepatitis B vaccine  You may need this if you have certain conditions or if you travel or work in places where you may be exposed to hepatitis B. Haemophilus influenzae type b (Hib) vaccine  You may need this if you have certain conditions. You may receive vaccines as individual doses or as more than one vaccine together in one shot (combination vaccines). Talk with your health care provider about the risks and benefits of combination vaccines. What tests do I need?  Blood tests  Lipid and cholesterol levels. These may be checked every 5 years starting at age 20.  Hepatitis C test.  Hepatitis B test. Screening  Diabetes screening. This is done by checking your blood sugar (glucose) after you have not eaten for a while (fasting).  Sexually transmitted disease (STD) testing.  BRCA-related cancer screening. This may be done if you have a family history of breast, ovarian, tubal, or peritoneal cancers.  Pelvic exam and Pap test. This may be done every 3 years starting at age 21. Starting at age 30, this may be done every 5 years if you have a Pap test in combination with an HPV test. Talk with your health care provider about your test results, treatment options, and if necessary, the need for more tests.   Follow these instructions at home: Eating and drinking   Eat a diet that includes fresh fruits and vegetables, whole grains, lean protein, and low-fat dairy.  Take vitamin and mineral supplements as recommended by your health care provider.  Do not drink alcohol if: ? Your health care provider tells you not to drink. ? You are  pregnant, may be pregnant, or are planning to become pregnant.  If you drink alcohol: ? Limit how much you have to 0-1 drink a day. ? Be aware of how much alcohol is in your drink. In the [place] one drink equals one 12 oz bottle of beer (355 mL), one 5 oz glass of wine (148 mL), or one 1 oz glass of hard liquor (44 mL). Lifestyle  Take daily care of your teeth and gums.  Stay active. Exercise for at least 30 minutes on 5 or more days each week.  Do not use any products that contain nicotine or tobacco, such as cigarettes, e-cigarettes, and chewing tobacco. If you need help quitting, ask your health care provider.  If you are sexually active, practice safe sex. Use a condom or other form of birth control (contraception) in order to prevent pregnancy and STIs (sexually transmitted infections). If you plan to become pregnant, see your health care provider for a preconception visit. What's next?  Visit your health care provider once a year for a well check visit.  Ask your health care provider how often you should have your eyes and teeth checked.  Stay up to date on all vaccines. This information is not intended to replace advice given to you by your health care provider. Make sure you discuss any questions you have with your health care provider. Document Revised: 05/24/2018 Document Reviewed: 05/24/2018 Elsevier Patient Education  [place].    Abnormal Uterine Bleeding Abnormal uterine bleeding is unusual bleeding from the uterus. It includes:  Bleeding or spotting between periods.  Bleeding after sex.  Bleeding that is heavier than normal.  Periods that last longer than usual.  Bleeding after menopause. Abnormal uterine bleeding can affect women at various stages in life, including teenagers, women in their reproductive years, pregnant women, and women who have reached menopause. Common causes of abnormal uterine bleeding include:  Pregnancy.  Growths of tissue  (polyps).  A noncancerous tumor in the uterus (fibroid).  Infection.  Cancer.  Hormonal imbalances. Any type of abnormal bleeding should be evaluated by a health care provider. Many cases are minor and simple to treat, while others are more serious. Treatment will depend on the cause of the bleeding. Follow these instructions at home:  Monitor your condition for any changes.  Do not use tampons, douche, or have sex if told by your health care provider.  Change your pads often.  Get regular exams that include pelvic exams and cervical cancer screening.  Keep all follow-up visits as told by your health care provider. This is important. Contact a health care provider if:  Your bleeding lasts for more than one week.  You feel dizzy at times.  You feel nauseous or you vomit. Get help right away if:  You pass out.  Your bleeding soaks through a pad every hour.  You have abdominal pain.  You have a fever.  You become sweaty or weak.  You pass large blood clots from your vagina. Summary  Abnormal uterine bleeding is unusual bleeding from the uterus.  Any type of abnormal bleeding should be evaluated by a health care provider.  Many cases are minor and simple to treat, while others are more serious.  Treatment will depend on the cause of the bleeding. This information is not intended to replace advice given to you by your health care provider. Make sure you discuss any questions you have with your health care provider. Document Revised: 12/20/2017 Document Reviewed: 10/14/2016 Elsevier Patient Education  [place].

## 2019-12-13 LAB — CERVICOVAGINAL ANCILLARY ONLY
Bacterial Vaginitis (gardnerella): NEGATIVE
Candida Glabrata: NEGATIVE
Candida Vaginitis: NEGATIVE
Chlamydia: NEGATIVE
Comment: NEGATIVE
Comment: NEGATIVE
Comment: NEGATIVE
Comment: NEGATIVE
Comment: NEGATIVE
Comment: NORMAL
Neisseria Gonorrhea: NEGATIVE
Trichomonas: NEGATIVE

## 2019-12-16 LAB — CYTOLOGY - PAP
Comment: NEGATIVE
Diagnosis: UNDETERMINED — AB
High risk HPV: NEGATIVE

## 2019-12-17 LAB — DHEA-SULFATE: DHEA-SO4: 199 ug/dL (ref 23–266)

## 2019-12-17 LAB — CBC
HCT: 38.3 % (ref 35.0–45.0)
Hemoglobin: 12.6 g/dL (ref 11.7–15.5)
MCH: 29.3 pg (ref 27.0–33.0)
MCHC: 32.9 g/dL (ref 32.0–36.0)
MCV: 89.1 fL (ref 80.0–100.0)
MPV: 10.5 fL (ref 7.5–12.5)
Platelets: 336 10*3/uL (ref 140–400)
RBC: 4.3 10*6/uL (ref 3.80–5.10)
RDW: 12.5 % (ref 11.0–15.0)
WBC: 16.4 10*3/uL — ABNORMAL HIGH (ref 3.8–10.8)

## 2019-12-17 LAB — TSH: TSH: 3.02 mIU/L

## 2019-12-17 LAB — TESTOSTERONE, FREE: TESTOSTERONE FREE: 2.4 pg/mL (ref 0.2–5.0)

## 2019-12-17 LAB — FOLLICLE STIMULATING HORMONE: FSH: 6.3 m[IU]/mL

## 2019-12-17 LAB — TESTOSTERONE: Testosterone: 23 ng/dL

## 2019-12-17 LAB — HEPATITIS B SURFACE ANTIGEN: Hepatitis B Surface Ag: NONREACTIVE

## 2019-12-17 LAB — RPR: RPR Ser Ql: NONREACTIVE

## 2019-12-17 LAB — HEPATITIS C ANTIBODY
Hepatitis C Ab: NONREACTIVE
SIGNAL TO CUT-OFF: 0.01 (ref <1.00)

## 2019-12-17 LAB — HIV ANTIBODY (ROUTINE TESTING W REFLEX): HIV 1&2 Ab, 4th Generation: NONREACTIVE

## 2019-12-17 LAB — HCG, QUANTITATIVE, PREGNANCY: HCG, Total, QN: <3 m[IU]/mL

## 2019-12-17 LAB — PROLACTIN: Prolactin: 9.5 ng/mL

## 2019-12-25 ENCOUNTER — Other Ambulatory Visit: Payer: Self-pay

## 2019-12-25 ENCOUNTER — Ambulatory Visit (HOSPITAL_COMMUNITY)
Admission: RE | Admit: 2019-12-25 | Discharge: 2019-12-25 | Disposition: A | Discharge status: Home / Self Care | Payer: Managed Care, Other (non HMO) | Source: Ambulatory Visit | Attending: Obstetrics & Gynecology | Admitting: Obstetrics & Gynecology

## 2019-12-25 DIAGNOSIS — N939 Abnormal uterine and vaginal bleeding, unspecified: Secondary | ICD-10-CM

## 2019-12-30 ENCOUNTER — Ambulatory Visit: Payer: Managed Care, Other (non HMO) | Admitting: Obstetrics & Gynecology

## 2020-01-10 ENCOUNTER — Ambulatory Visit (INDEPENDENT_AMBULATORY_CARE_PROVIDER_SITE_OTHER): Payer: Managed Care, Other (non HMO) | Admitting: Certified Nurse Midwife

## 2020-01-10 ENCOUNTER — Encounter: Payer: Self-pay | Admitting: Certified Nurse Midwife

## 2020-01-10 ENCOUNTER — Other Ambulatory Visit: Payer: Self-pay

## 2020-01-10 VITALS — BP 139/76 | HR 88 | Temp 98.3°F

## 2020-01-10 DIAGNOSIS — Z30017 Encounter for initial prescription of implantable subdermal contraceptive: Secondary | ICD-10-CM

## 2020-01-10 DIAGNOSIS — Z3046 Encounter for surveillance of implantable subdermal contraceptive: Secondary | ICD-10-CM | POA: Diagnosis not present

## 2020-01-10 MED ORDER — ETONOGESTREL 68 MG ~~LOC~~ IMPL: 68.0000 mg | DRUG_IMPLANT | Freq: Once | SUBCUTANEOUS | Status: DC

## 2020-01-10 MED ORDER — ETONOGESTREL 68 MG ~~LOC~~ IMPL
68.0000 mg | DRUG_IMPLANT | Freq: Once | SUBCUTANEOUS | Status: AC
  Administered 2020-01-10: 68 mg via SUBCUTANEOUS

## 2020-01-10 NOTE — Progress Notes (Signed)
   GYNECOLOGY OFFICE VISIT NOTE  History:  39 y.o. [id number] here today for Nexplanon removal and reinsertion. Her Nexplanon was placed 02/06/2017. She denies any abnormal vaginal discharge, bleeding, pelvic pain or other concerns.   Past Medical History:  Diagnosis Date  . Allergy   . Anxiety   . Deafness in right ear   . Depression   . Diabetes mellitus without complication (HCC)   . Gestational diabetes 03/06/2013   glyburide  . Hernia   . Hypertension   . Morbid obesity (HCC)   . Plantar fasciitis   . PONV (postoperative nausea and vomiting)   . Sleep apnea     Past Surgical History:  Procedure Laterality Date  . ABDOMINAL HERNIA REPAIR    . CESAREAN SECTION      The following portions of the patient's history were reviewed and updated as appropriate: allergies, current medications, past family history, past medical history, past social history, past surgical history and problem list.   Health Maintenance:  Normal pap and negative HRHPV on 12/12/19.   Review of Systems:  Neg except stated in HPI  Objective:  Physical Exam BP 139/76   Pulse 88   Temp 98.3 F (36.8 C)  CONSTITUTIONAL: Well-developed, well-nourished female in no acute distress.  HENT:  Normocephalic, atraumatic EYES: Conjunctivae and EOM are normal NECK: Normal range of motion SKIN: Skin is warm and dry NEUROLOGIC: Alert and oriented to person, place, and time PSYCHIATRIC: Normal mood and affect CARDIOVASCULAR: Normal heart rate noted RESPIRATORY: Effort and rate normal MUSCULOSKELETAL: Normal range of motion  Labs and Imaging No results found for this or any previous visit (from the past 24 hour(s)).  GYNECOLOGY PROCEDURE NOTE  [name] is a 39 y.o. [id number] here for Nexplanon removal and re-insertion. No other gynecologic concerns.  Nexplanon Removal and Insertion  Patient identified, informed consent performed, consent signed.   Patient does understand that irregular bleeding is a  very common side effect of this medication. She was advised to have backup contraception for one week after replacement of the implant.  Appropriate time out taken. Implant site identified. Area prepped in usual sterile fashon. One ml of 1% lidocaine was used to anesthetize the area at the distal end of the implant. A small stab incision was made right beside the implant on the distal portion. The Nexplanon rod was grasped using hemostats and removed without difficulty. There was minimal blood loss. There were no complications. Nexplanon removed from packaging, Device confirmed in needle, then inserted superficially the full length of needle and withdrawn per handbook instructions into previous incision site. Nexplanon was able to palpated in the patient's arm. There was minimal blood loss. Patient insertion site covered with steri strips, guaze and a pressure bandage to reduce any bruising. The patient tolerated the procedure well and was given post procedure instructions.  She was advised to have backup contraception for one week.    Donette Larry, CNM [facility] [employer], [facility]  Assessment & Plan:  Removal and reinsertion of Nexplanon Meds ordered this encounter  Medications  . DISCONTD: etonogestrel (NEXPLANON) implant 68 mg  . etonogestrel (NEXPLANON) implant 68 mg   Follow up prn  Donette Larry, CNM 01/10/2020 11:23 AM

## 2020-02-07 ENCOUNTER — Ambulatory Visit (HOSPITAL_COMMUNITY)
Admission: EM | Admit: 2020-02-07 | Discharge: 2020-02-07 | Disposition: A | Discharge status: Home / Self Care | Payer: Managed Care, Other (non HMO) | Attending: Urgent Care | Admitting: Urgent Care

## 2020-02-07 ENCOUNTER — Other Ambulatory Visit: Payer: Self-pay

## 2020-02-07 ENCOUNTER — Encounter (HOSPITAL_COMMUNITY): Payer: Self-pay

## 2020-02-07 DIAGNOSIS — M7918 Myalgia, other site: Secondary | ICD-10-CM

## 2020-02-07 DIAGNOSIS — L089 Local infection of the skin and subcutaneous tissue, unspecified: Secondary | ICD-10-CM

## 2020-02-07 DIAGNOSIS — L729 Follicular cyst of the skin and subcutaneous tissue, unspecified: Secondary | ICD-10-CM | POA: Diagnosis not present

## 2020-02-07 MED ORDER — FLUCONAZOLE 150 MG PO TABS: 150.0000 mg | ORAL_TABLET | ORAL | 0 refills | Status: DC

## 2020-02-07 MED ORDER — NAPROXEN 500 MG PO TABS: 500.0000 mg | ORAL_TABLET | Freq: Two times a day (BID) | ORAL | 0 refills | Status: DC

## 2020-02-07 MED ORDER — DOXYCYCLINE HYCLATE 100 MG PO CAPS: 100.0000 mg | ORAL_CAPSULE | Freq: Two times a day (BID) | ORAL | 0 refills | Status: DC

## 2020-02-07 NOTE — ED Triage Notes (Signed)
Pt presents with abscess on right buttocks area X 3 days.

## 2020-02-07 NOTE — Discharge Instructions (Addendum)
Please change your dressing 2-3 times daily using non-adherent dressing and secure with medical tape. Do not apply any ointments or creams. Each time you change your dressing, make sure you clean gently around the perimeter of the wound with gentle soap and warm water. Pat your wound dry before reapplying another dressing.  Once your wound scabs over and starts to heal, you no longer have to use dressing changes.

## 2020-02-07 NOTE — ED Provider Notes (Signed)
[facility]   MRN: [id number] DOB: [birth year]  Subjective:   [name] is a 39 y.o. female presenting for 3-day history of acute onset worsening right-sided buttock pain, can feel a boil.  She has had worsening difficulty sitting over the area.  Has not tried medications for relief.  Denies difficulty with abscesses or infections of the skin in the past.  No current facility-administered medications for this encounter.  Current Outpatient Medications:  .  albuterol (PROVENTIL HFA;VENTOLIN HFA) 108 (90 BASE) MCG/ACT inhaler, Inhale 2 puffs into the lungs every 6 (six) hours as needed for wheezing., Disp: , Rfl:  .  busPIRone (BUSPAR) 7.5 MG tablet, Take by mouth., Disp: , Rfl:  .  cetirizine (ZYRTEC) 10 MG tablet, Take by mouth., Disp: , Rfl:  .  docusate sodium (COLACE) 100 MG capsule, Take 100 mg by mouth 2 (two) times daily., Disp: , Rfl:  .  escitalopram (LEXAPRO) 10 MG tablet, Take by mouth., Disp: , Rfl:  .  etonogestrel (NEXPLANON) 68 MG IMPL implant, Inject into the skin., Disp: , Rfl:  .  gabapentin (NEURONTIN) 300 MG capsule, , Disp: , Rfl: 1 .  hydrochlorothiazide (HYDRODIURIL) 25 MG tablet, Take by mouth., Disp: , Rfl:  .  ibuprofen (ADVIL,MOTRIN) 600 MG tablet, Take 1 tablet (600 mg total) by mouth every 6 (six) hours., Disp: 30 tablet, Rfl: 0 .  lamoTRIgine (LAMICTAL) 100 MG tablet, , Disp: , Rfl: 0 .  losartan (COZAAR) 50 MG tablet, Take by mouth., Disp: , Rfl:  .  oxyCODONE-acetaminophen (PERCOCET/ROXICET) 5-325 MG per tablet, Take by mouth every 4 (four) hours as needed for severe pain., Disp: , Rfl:  .  Prenatal Vit-Fe Fumarate-FA (PRENATAL MULTIVITAMIN) TABS, Take 1 tablet by mouth daily at 12 noon., Disp: , Rfl:    No Known Allergies  Past Medical History:  Diagnosis Date  . Allergy   . Anxiety   . Deafness in right ear   . Depression   . Diabetes mellitus without complication (HCC)   . Gestational diabetes 03/06/2013   glyburide  . Hernia   .  Hypertension   . Morbid obesity (HCC)   . Plantar fasciitis   . PONV (postoperative nausea and vomiting)   . Sleep apnea      Past Surgical History:  Procedure Laterality Date  . ABDOMINAL HERNIA REPAIR    . CESAREAN SECTION      Family History  Problem Relation Age of Onset  . Cancer Maternal Grandmother        colon  . Dementia Maternal Grandfather   . Hypothyroidism Mother   . Hypertension Mother     Social History   Tobacco Use  . Smoking status: Never Smoker  . Smokeless tobacco: Never Used  Substance Use Topics  . Alcohol use: Yes    Comment: socially  . Drug use: No    ROS   Objective:   Vitals: BP (!) 137/59 (BP Location: Left Arm)   Pulse 66   Temp 98.6 F (37 C) (Oral)   Resp 16   SpO2 99%   Physical Exam Exam conducted with a chaperone present ([employer] Drone).  Constitutional:      General: She is not in acute distress.    Appearance: Normal appearance. She is well-developed. She is obese. She is not ill-appearing, toxic-appearing or diaphoretic.  HENT:     Head: Normocephalic and atraumatic.     Nose: Nose normal.     Mouth/Throat:  Mouth: Mucous membranes are moist.     Pharynx: Oropharynx is clear.  Eyes:     General: No scleral icterus.    Extraocular Movements: Extraocular movements intact.     Pupils: Pupils are equal, round, and reactive to light.  Cardiovascular:     Rate and Rhythm: Normal rate.  Pulmonary:     Effort: Pulmonary effort is normal.  Genitourinary:   Skin:    General: Skin is warm and dry.  Neurological:     General: No focal deficit present.     Mental Status: She is alert and oriented to person, place, and time.  Psychiatric:        Mood and Affect: Mood normal.        Behavior: Behavior normal.        Thought Content: Thought content normal.        Judgment: Judgment normal.     PROCEDURE NOTE: I&D of Abscess Verbal consent obtained. Local anesthesia with topical bezoncaine. Site cleansed with  chlorhexidine. Area lanced ~1/2cm using a 11 blade, quick expulsion of cyst with sac. Cleansed and dressed.   Assessment and Plan :   PDMP not reviewed this encounter.  1. Infected cyst of skin   2. Acute buttock pain     Sx turned out to be an infected cyst. This was excised successfully. Start doxycycline, naproxen for pain and inflammation.  Patient has antibiotic associated yeast infections.  Will use Diflucan for this. Counseled patient on potential for adverse effects with medications prescribed/recommended today, ER and return-to-clinic precautions discussed, patient verbalized understanding.    Jaynee Eagles, PA-C 02/07/20 2010

## 2020-11-30 DIAGNOSIS — G8929 Other chronic pain: Secondary | ICD-10-CM | POA: Insufficient documentation

## 2020-12-03 DIAGNOSIS — E785 Hyperlipidemia, unspecified: Secondary | ICD-10-CM | POA: Insufficient documentation

## 2021-03-08 ENCOUNTER — Encounter: Payer: Self-pay | Admitting: *Deleted

## 2021-03-08 ENCOUNTER — Other Ambulatory Visit: Payer: Self-pay

## 2021-03-08 ENCOUNTER — Ambulatory Visit
Admission: EM | Admit: 2021-03-08 | Discharge: 2021-03-08 | Disposition: A | Discharge status: Home / Self Care | Payer: Managed Care, Other (non HMO)

## 2021-03-08 DIAGNOSIS — R0789 Other chest pain: Secondary | ICD-10-CM

## 2021-03-08 DIAGNOSIS — S29011A Strain of muscle and tendon of front wall of thorax, initial encounter: Secondary | ICD-10-CM

## 2021-03-08 DIAGNOSIS — I10 Essential (primary) hypertension: Secondary | ICD-10-CM | POA: Diagnosis not present

## 2021-03-08 MED ORDER — TIZANIDINE HCL 2 MG PO CAPS: 2.0000 mg | ORAL_CAPSULE | Freq: Three times a day (TID) | ORAL | 0 refills | Status: DC

## 2021-03-08 NOTE — Discharge Instructions (Addendum)
-  Start the muscle relaxer-Zanaflex (tizanidine), up to 3 times daily for muscle spasms and pain.  This can make you drowsy, so take at bedtime or when you do not need to drive or operate machinery. You can take ibuprofen for additional relief. -For hypertension, continue current regimen -Your EKG and exam are reassuring today. Your pain is most likely caused by muscular strain. I can't 100% rule out cardiac pathology in the urgent care setting- for this, you would need to go to the ED for full cardiac workup. If your symptoms worsen, like left-sided chest pain, dizziness, shortness of breath, weakness on one side of your body, severe headaches- call 911 or have someone drive you to the ED.

## 2021-03-08 NOTE — ED Provider Notes (Signed)
[facility]    CSN: [id number] Arrival date & time: 03/08/21  1640      History   Chief Complaint Chief Complaint  Patient presents with   Chest Pain    HPI [name] is a 40 y.o. female presenting with right-side chest pain x2 hours.  Medical history diabetes, hypertension, morbid obesity, sleep apnea.  Patient describes sudden onset of right-sided chest pain 2 hours ago while driving.  States pain is much worse with movement of her right upper extremity, deep inspiration, coughing.  Pain is radiating to her right shoulder and trapezius. Unable to turn her head to the right due to the pain.  Baby aspirin and ibuprofen providing minimal relief. Denies left-sided chest pain, central chest pain, ripping or burning chest pain radiating to back, weakness in arms or legs, severe headaches, vision changes, dizziness, shortness of breath, syncope.   HPI  Past Medical History:  Diagnosis Date   Allergy    Anxiety    Deafness in right ear    Depression    Diabetes mellitus without complication (HCC)    Gestational diabetes 03/06/2013   glyburide   Hernia    Hypertension    Morbid obesity (HCC)    Plantar fasciitis    PONV (postoperative nausea and vomiting)    Sleep apnea     Patient Active Problem List   Diagnosis Date Noted   Morbid obesity with BMI of 45.0-49.9, adult (HCC) 12/12/2019   Abnormal uterine bleeding (AUB) 12/12/2019   Nexplanon in place 12/12/2019   Diabetes mellitus without complication (HCC)    Hypertension     Past Surgical History:  Procedure Laterality Date   ABDOMINAL HERNIA REPAIR     CESAREAN SECTION     HERNIA REPAIR      OB History     Gravida  2   Para  2   Term  2   Preterm  0   AB  0   Living  2      SAB  0   IAB  0   Ectopic  0   Multiple  0   Live Births  2            Home Medications    Prior to Admission medications   Medication Sig Start Date End Date Taking? Authorizing Provider   ASPIRIN 81 PO Take by mouth.   Yes [provider]  cetirizine (ZYRTEC) 10 MG tablet Take by mouth.   Yes [provider]  docusate sodium (COLACE) 100 MG capsule Take 100 mg by mouth 2 (two) times daily.   Yes [provider]  etonogestrel (NEXPLANON) 68 MG IMPL implant Inject into the skin.   Yes [provider]  hydrochlorothiazide (HYDRODIURIL) 25 MG tablet Take by mouth. 11/27/19  Yes [provider]  ibuprofen (ADVIL,MOTRIN) 600 MG tablet Take 1 tablet (600 mg total) by mouth every 6 (six) hours. 07/28/13  Yes Beck, Redmond Baseman, MD  losartan (COZAAR) 50 MG tablet Take by mouth. 12/12/19  Yes [provider]  metoprolol tartrate (LOPRESSOR) 25 MG tablet Take 25 mg by mouth 2 (two) times daily.   Yes [provider]  pravastatin (PRAVACHOL) 80 MG tablet Take 80 mg by mouth daily.   Yes [provider]  Probiotic Product (PROBIOTIC PO) Take by mouth.   Yes [provider]  Semaglutide (OZEMPIC, 0.25 OR 0.5 MG/DOSE, [place]) Inject into the skin.   Yes [provider]  tizanidine (  ZANAFLEX) 2 MG capsule Take 1 capsule (2 mg total) by mouth 3 (three) times daily. 03/08/21  Yes Rhys MartiniGraham, Makena Murdock E, PA-C  albuterol (PROVENTIL HFA;VENTOLIN HFA) 108 (90 BASE) MCG/ACT inhaler Inhale 2 puffs into the lungs every 6 (six) hours as needed for wheezing.    [provider]  busPIRone (BUSPAR) 7.5 MG tablet Take by mouth. 07/26/16   [provider]  doxycycline (VIBRAMYCIN) 100 MG capsule Take 1 capsule (100 mg total) by mouth 2 (two) times daily. 02/07/20   Wallis BambergMani, Mario, PA-C  escitalopram (LEXAPRO) 10 MG tablet Take by mouth. 11/14/16 11/14/17  [provider]  fluconazole (DIFLUCAN) 150 MG tablet Take 1 tablet (150 mg total) by mouth once a week. 02/07/20   Wallis BambergMani, Mario, PA-C  gabapentin (NEURONTIN) 300 MG capsule  01/18/17   [provider]  lamoTRIgine (LAMICTAL) 100 MG tablet  01/25/17   [provider]  naproxen (NAPROSYN) 500 MG tablet Take 1 tablet (500 mg total) by mouth 2 (two) times daily. 02/07/20   Wallis BambergMani, Mario, PA-C  oxyCODONE-acetaminophen (PERCOCET/ROXICET) 5-325 MG per tablet Take by mouth every 4 (four) hours as needed for severe pain.    [provider]  Prenatal Vit-Fe Fumarate-FA (PRENATAL MULTIVITAMIN) TABS Take 1 tablet by mouth daily at 12 noon.    [provider]    Family History Family History  Problem Relation Age of Onset   Hypothyroidism Mother    Hypertension Mother    Cancer Maternal Grandmother        colon   Dementia Maternal Grandfather     Social History Social History   Tobacco Use   Smoking status: Never   Smokeless tobacco: Never  Vaping Use   Vaping Use: Never used  Substance Use Topics   Alcohol use: Yes    Comment: socially   Drug use: No     Allergies   Patient has no known allergies.   Review of Systems Review of Systems  Musculoskeletal:        R chest wall pain  All other systems reviewed and are negative.   Physical Exam Triage Vital Signs ED Triage Vitals  Enc Vitals Group     BP 03/08/21 1650 123/84     Pulse Rate 03/08/21 1650 85     Resp 03/08/21 1650 20     Temp 03/08/21 1650 97.7 F (36.5 C)     Temp Source 03/08/21 1650 Temporal     SpO2 03/08/21 1650 97 %     Weight --      Height --      Head Circumference --      Peak Flow --      Pain Score 03/08/21 1652 7     Pain Loc --      Pain Edu? --      Excl. in GC? --    No data found.  Updated Vital Signs BP 123/84   Pulse 85   Temp 97.7 F (36.5 C) (Temporal)   Resp 20   SpO2 97%   Visual Acuity Right Eye Distance:   Left Eye Distance:   Bilateral Distance:    Right Eye Near:   Left Eye Near:    Bilateral Near:     Physical Exam Vitals reviewed.  Constitutional:      Appearance: Normal appearance. She is not diaphoretic.  HENT:     Head: Normocephalic and atraumatic.     Mouth/Throat:     Mouth: Mucous  membranes  are moist.  Eyes:     Extraocular Movements: Extraocular movements intact.     Pupils: Pupils are equal, round, and reactive to light.  Cardiovascular:     Rate and Rhythm: Normal rate and regular rhythm.     Pulses:          Radial pulses are 2+ on the right side and 2+ on the left side.     Heart sounds: Normal heart sounds.     Comments: Negative homan sign bilaterally Radial pulse 2+ and equal  No pedal edema Pulmonary:     Effort: Pulmonary effort is normal. No tachypnea, accessory muscle usage or prolonged expiration.     Breath sounds: Normal breath sounds. No decreased breath sounds, wheezing, rhonchi or rales.     Comments: Breath sounds full throughout Chest:     Chest wall: Tenderness present.     Comments: Significantly TTP over R pectoral muscle and R deltoid.  Abdominal:     Palpations: Abdomen is soft.     Tenderness: There is no abdominal tenderness. There is no guarding or rebound.  Musculoskeletal:     Right lower leg: No edema.     Left lower leg: No edema.     Comments: Significantly TTP over R pectoral muscle and R deltoid. ROM R shoulder intact but pain with abduction and crossbody adduction. Strength 5/5 in Ues and Les, gait intact. Grip strength 5/5 bilaterally. Sensation intact Ues and Les.   Skin:    General: Skin is warm.     Capillary Refill: Capillary refill takes less than 2 seconds.  Neurological:     General: No focal deficit present.     Mental Status: She is alert and oriented to person, place, and time.     Comments: PERRLA, EOMI CN2-12 grossly intact Negative rhomberg   Psychiatric:        Mood and Affect: Mood normal.        Behavior: Behavior normal.        Thought Content: Thought content normal.        Judgment: Judgment normal.     UC Treatments / Results  Labs (all labs ordered are listed, but only abnormal results are displayed) Labs Reviewed - No data to display  EKG   Radiology No results  found.  Procedures Procedures (including critical care time)  Medications Ordered in UC Medications - No data to display  Initial Impression / Assessment and Plan / UC Course  I have reviewed the triage vital signs and the nursing notes.  Pertinent labs & imaging results that were available during my care of the patient were reviewed by me and considered in my medical decision making (see chart for details).     This patient is a 40 year old female presenting with right-sided chest pain that is reproducible.  EKG nsr, no prior ekg for comparison. Radial pulse equal and symmetric in bilateral UEs, low suspicion for aortic dissection. Benign neuro exam. Pt with diagnosis of hypertension, bp is well controlled on current regimen. Also with dx diabetes, obesity.   Reassurance provided. Discussed that while I cannot completely rule out a cardiac event without further cardiac workup, I have a low suspicion for this. Zanaflex sent.  STRICT ED return precautions discussed. Patient is a Engineer, civil (consulting). She verbalizes understanding and agreement.   Final Clinical Impressions(s) / UC Diagnoses   Final diagnoses:  Essential hypertension  Pectoralis muscle strain, initial encounter  Atypical chest pain     Discharge Instructions      -  Start the muscle relaxer-Zanaflex (tizanidine), up to 3 times daily for muscle spasms and pain.  This can make you drowsy, so take at bedtime or when you do not need to drive or operate machinery. You can take ibuprofen for additional relief. -For hypertension, continue current regimen -Your EKG and exam are reassuring today. Your pain is most likely caused by muscular strain. I can't 100% rule out cardiac pathology in the urgent care setting- for this, you would need to go to the ED for full cardiac workup. If your symptoms worsen, like left-sided chest pain, dizziness, shortness of breath, weakness on one side of your body, severe headaches- call 911 or have someone  drive you to the ED.       ED Prescriptions     Medication Sig Dispense Auth. Provider   tizanidine (ZANAFLEX) 2 MG capsule Take 1 capsule (2 mg total) by mouth 3 (three) times daily. 10 capsule Rhys Martini, PA-C      PDMP not reviewed this encounter.   Rhys Martini, PA-C 03/08/21 1736

## 2021-03-08 NOTE — ED Triage Notes (Addendum)
C/O sudden onset right-sided chest pain approx 2 hrs ago while driving.  States pain much worse with any movement of RUE, deep breathing, coughing.  Pt states unable to turn her head to the right due to pain in right chest.Took an ASA and IBU with only slight relief.

## 2021-08-16 ENCOUNTER — Emergency Department (HOSPITAL_BASED_OUTPATIENT_CLINIC_OR_DEPARTMENT_OTHER): Payer: Managed Care, Other (non HMO)

## 2021-08-16 ENCOUNTER — Emergency Department (HOSPITAL_BASED_OUTPATIENT_CLINIC_OR_DEPARTMENT_OTHER)
Admission: EM | Admit: 2021-08-16 | Discharge: 2021-08-16 | Disposition: A | Discharge status: Home / Self Care | Payer: Managed Care, Other (non HMO) | Attending: Emergency Medicine | Admitting: Emergency Medicine

## 2021-08-16 ENCOUNTER — Emergency Department (HOSPITAL_BASED_OUTPATIENT_CLINIC_OR_DEPARTMENT_OTHER): Payer: Managed Care, Other (non HMO) | Admitting: Radiology

## 2021-08-16 ENCOUNTER — Encounter (HOSPITAL_BASED_OUTPATIENT_CLINIC_OR_DEPARTMENT_OTHER): Payer: Self-pay

## 2021-08-16 ENCOUNTER — Other Ambulatory Visit: Payer: Self-pay

## 2021-08-16 DIAGNOSIS — M25551 Pain in right hip: Secondary | ICD-10-CM | POA: Diagnosis present

## 2021-08-16 DIAGNOSIS — I10 Essential (primary) hypertension: Secondary | ICD-10-CM | POA: Diagnosis not present

## 2021-08-16 DIAGNOSIS — Z79899 Other long term (current) drug therapy: Secondary | ICD-10-CM | POA: Insufficient documentation

## 2021-08-16 DIAGNOSIS — Z7984 Long term (current) use of oral hypoglycemic drugs: Secondary | ICD-10-CM | POA: Insufficient documentation

## 2021-08-16 DIAGNOSIS — M7989 Other specified soft tissue disorders: Secondary | ICD-10-CM | POA: Insufficient documentation

## 2021-08-16 DIAGNOSIS — M25561 Pain in right knee: Secondary | ICD-10-CM | POA: Insufficient documentation

## 2021-08-16 DIAGNOSIS — Z7982 Long term (current) use of aspirin: Secondary | ICD-10-CM | POA: Insufficient documentation

## 2021-08-16 DIAGNOSIS — E119 Type 2 diabetes mellitus without complications: Secondary | ICD-10-CM | POA: Diagnosis not present

## 2021-08-16 DIAGNOSIS — M5431 Sciatica, right side: Secondary | ICD-10-CM | POA: Diagnosis not present

## 2021-08-16 LAB — URINALYSIS, ROUTINE W REFLEX MICROSCOPIC
Bilirubin Urine: NEGATIVE
Glucose, UA: NEGATIVE mg/dL
Hgb urine dipstick: NEGATIVE
Ketones, ur: NEGATIVE mg/dL
Nitrite: NEGATIVE
Specific Gravity, Urine: 1.029 (ref 1.005–1.030)
pH: 6 (ref 5.0–8.0)

## 2021-08-16 LAB — COMPREHENSIVE METABOLIC PANEL
ALT: 7 U/L (ref 0–44)
AST: 12 U/L — ABNORMAL LOW (ref 15–41)
Albumin: 3.9 g/dL (ref 3.5–5.0)
Alkaline Phosphatase: 62 U/L (ref 38–126)
Anion gap: 8 (ref 5–15)
BUN: 9 mg/dL (ref 6–20)
CO2: 23 mmol/L (ref 22–32)
Calcium: 9.1 mg/dL (ref 8.9–10.3)
Chloride: 107 mmol/L (ref 98–111)
Creatinine, Ser: 0.67 mg/dL (ref 0.44–1.00)
GFR, Estimated: >60 mL/min (ref >60)
Glucose, Bld: 106 mg/dL — ABNORMAL HIGH (ref 70–99)
Potassium: 4.4 mmol/L (ref 3.5–5.1)
Sodium: 138 mmol/L (ref 135–145)
Total Bilirubin: 0.7 mg/dL (ref 0.3–1.2)
Total Protein: 6.9 g/dL (ref 6.5–8.1)

## 2021-08-16 LAB — CBC WITH DIFFERENTIAL/PLATELET
Abs Immature Granulocytes: 0.05 10*3/uL (ref 0.00–0.07)
Basophils Absolute: 0.1 10*3/uL (ref 0.0–0.1)
Basophils Relative: 0 %
Eosinophils Absolute: 0.1 10*3/uL (ref 0.0–0.5)
Eosinophils Relative: 1 %
HCT: 37.4 % (ref 36.0–46.0)
Hemoglobin: 12.1 g/dL (ref 12.0–15.0)
Immature Granulocytes: 0 %
Lymphocytes Relative: 20 %
Lymphs Abs: 3.2 10*3/uL (ref 0.7–4.0)
MCH: 29.4 pg (ref 26.0–34.0)
MCHC: 32.4 g/dL (ref 30.0–36.0)
MCV: 91 fL (ref 80.0–100.0)
Monocytes Absolute: 1.1 10*3/uL — ABNORMAL HIGH (ref 0.1–1.0)
Monocytes Relative: 7 %
Neutro Abs: 11 10*3/uL — ABNORMAL HIGH (ref 1.7–7.7)
Neutrophils Relative %: 72 %
Platelets: 274 10*3/uL (ref 150–400)
RBC: 4.11 MIL/uL (ref 3.87–5.11)
RDW: 13.1 % (ref 11.5–15.5)
WBC: 15.6 10*3/uL — ABNORMAL HIGH (ref 4.0–10.5)
nRBC: 0 % (ref 0.0–0.2)

## 2021-08-16 MED ORDER — OXYCODONE-ACETAMINOPHEN 5-325 MG PO TABS
2.0000 | ORAL_TABLET | Freq: Once | ORAL | Status: AC
  Administered 2021-08-16: 2 via ORAL
  Filled 2021-08-16: qty 2

## 2021-08-16 MED ORDER — MELOXICAM 7.5 MG PO TABS: 7.5000 mg | ORAL_TABLET | Freq: Every day | ORAL | 0 refills | Status: AC

## 2021-08-16 MED ORDER — METHOCARBAMOL 500 MG PO TABS: 500.0000 mg | ORAL_TABLET | Freq: Three times a day (TID) | ORAL | 0 refills | Status: DC | PRN

## 2021-08-16 MED ORDER — METHOCARBAMOL 500 MG PO TABS
1000.0000 mg | ORAL_TABLET | Freq: Once | ORAL | Status: AC
  Administered 2021-08-16: 1000 mg via ORAL
  Filled 2021-08-16: qty 2

## 2021-08-16 MED ORDER — MELOXICAM 7.5 MG PO TABS: 7.5000 mg | ORAL_TABLET | Freq: Every day | ORAL | 0 refills | Status: DC

## 2021-08-16 MED ORDER — KETOROLAC TROMETHAMINE 15 MG/ML IJ SOLN
15.0000 mg | Freq: Once | INTRAMUSCULAR | Status: AC
  Administered 2021-08-16: 15 mg via INTRAVENOUS
  Filled 2021-08-16: qty 1

## 2021-08-16 NOTE — ED Provider Notes (Signed)
[facility] Provider Note   CSN: [id number] Arrival date & time: 08/16/21  0753     History Chief Complaint  Patient presents with   Knee Pain    Knees and hip    [name] is a 40 y.o. female.  HPI Patient presents for right hip and right knee pain.  She has had similar symptoms in the past, which typically resolve within 24 hours.  Currently, she is on day 3 of current pain symptoms.  She has been taking Tylenol, ibuprofen, and muscle relaxer at home.  Pain worsened last night and cause her difficulty sleeping.  Today, she has had to use her mother's cane to offset weight from her right side due to the worsened pain with ambulation.  Area of maximal hip pain is the posterior right hip.  Pain in her proximal leg radiates down the posterior aspect.  She denies any recent injuries.  Onset was upon awakening, Saturday morning.  She has not had any fevers or chills.  She has not had difficulty with urinating or bowel movements.  She has not had any numbness.  Strength in her RLE is limited by pain.  Medical history notable for obesity, prediabetes, MDD, anxiety, OSA, and chronic dyspnea on exertion.    Past Medical History:  Diagnosis Date   Allergy    Anxiety    Deafness in right ear    Depression    Diabetes mellitus without complication ([place])    Gestational diabetes 03/06/2013   glyburide   Hernia    Hypertension    Morbid obesity ([place])    Plantar fasciitis    PONV (postoperative nausea and vomiting)    Sleep apnea     Patient Active Problem List   Diagnosis Date Noted   Morbid obesity with BMI of 45.0-49.9, adult ([place]) 12/12/2019   Abnormal uterine bleeding (AUB) 12/12/2019   Nexplanon in place 12/12/2019   Diabetes mellitus without complication ([place])    Hypertension     Past Surgical History:  Procedure Laterality Date   ABDOMINAL HERNIA REPAIR     CESAREAN SECTION     HERNIA REPAIR       OB History     Gravida  2   Para   2   Term  2   Preterm  0   AB  0   Living  2      SAB  0   IAB  0   Ectopic  0   Multiple  0   Live Births  2           Family History  Problem Relation Age of Onset   Hypothyroidism Mother    Hypertension Mother    Cancer Maternal Grandmother        colon   Dementia Maternal Grandfather     Social History   Tobacco Use   Smoking status: Never   Smokeless tobacco: Never  Vaping Use   Vaping Use: Never used  Substance Use Topics   Alcohol use: Yes    Comment: socially   Drug use: No    Home Medications Prior to Admission medications   Medication Sig Start Date End Date Taking? Authorizing Provider  meloxicam (MOBIC) 7.5 MG tablet Take 1 tablet (7.5 mg total) by mouth daily for 14 days. 08/16/21 08/30/21 Yes Godfrey Pick, MD  methocarbamol (ROBAXIN) 500 MG tablet Take 1 tablet (500 mg total) by mouth every 8 (eight) hours as needed for muscle spasms.  08/16/21  Yes Godfrey Pick, MD  albuterol (PROVENTIL HFA;VENTOLIN HFA) 108 (90 BASE) MCG/ACT inhaler Inhale 2 puffs into the lungs every 6 (six) hours as needed for wheezing.    [provider]  ASPIRIN 81 PO Take by mouth.    [provider]  busPIRone (BUSPAR) 7.5 MG tablet Take by mouth. 07/26/16   [provider]  cetirizine (ZYRTEC) 10 MG tablet Take by mouth.    [provider]  docusate sodium (COLACE) 100 MG capsule Take 100 mg by mouth 2 (two) times daily.    [provider]  doxycycline (VIBRAMYCIN) 100 MG capsule Take 1 capsule (100 mg total) by mouth 2 (two) times daily. 02/07/20   Jaynee Eagles, PA-C  escitalopram (LEXAPRO) 10 MG tablet Take by mouth. 11/14/16 11/14/17  [provider]  etonogestrel (NEXPLANON) 68 MG IMPL implant Inject into the skin.    [provider]  fluconazole (DIFLUCAN) 150 MG tablet Take 1 tablet (150 mg total) by mouth once a week. 02/07/20   Jaynee Eagles, PA-C  gabapentin (NEURONTIN) 300 MG capsule  01/18/17    [provider]  hydrochlorothiazide (HYDRODIURIL) 25 MG tablet Take by mouth. 11/27/19   [provider]  lamoTRIgine (LAMICTAL) 100 MG tablet  01/25/17   [provider]  losartan (COZAAR) 50 MG tablet Take by mouth. 12/12/19   [provider]  metoprolol tartrate (LOPRESSOR) 25 MG tablet Take 25 mg by mouth 2 (two) times daily.    [provider]  oxyCODONE-acetaminophen (PERCOCET/ROXICET) 5-325 MG per tablet Take by mouth every 4 (four) hours as needed for severe pain.    [provider]  pravastatin (PRAVACHOL) 80 MG tablet Take 80 mg by mouth daily.    [provider]  Prenatal Vit-Fe Fumarate-FA (PRENATAL MULTIVITAMIN) TABS Take 1 tablet by mouth daily at 12 noon.    [provider]  Probiotic Product (PROBIOTIC PO) Take by mouth.    [provider]  Semaglutide (OZEMPIC, 0.25 OR 0.5 MG/DOSE, [place]) Inject into the skin.    [provider]  tizanidine (ZANAFLEX) 2 MG capsule Take 1 capsule (2 mg total) by mouth 3 (three) times daily. 03/08/21   Hazel Sams, PA-C    Allergies    Patient has no known allergies.  Review of Systems   Review of Systems  Constitutional:  Negative for activity change, appetite change, chills, fatigue and fever.  HENT:  Negative for ear pain and sore throat.   Eyes:  Negative for pain and visual disturbance.  Respiratory:  Negative for cough and shortness of breath.   Cardiovascular:  Negative for chest pain and palpitations.  Gastrointestinal:  Negative for abdominal pain, nausea and vomiting.  Genitourinary:  Negative for decreased urine volume, dysuria, flank pain, hematuria and pelvic pain.  Musculoskeletal:  Positive for arthralgias. Negative for back pain, joint swelling, myalgias and neck pain.  Skin:  Negative for color change and rash.  Neurological:  Negative for dizziness, seizures, syncope, weakness, light-headedness and numbness.  Hematological:  Does not  bruise/bleed easily.  Psychiatric/Behavioral:  Negative for confusion and decreased concentration.   All other systems reviewed and are negative.  Physical Exam Updated Vital Signs BP 135/75   Pulse 84   Temp 98.6 F (37 C) (Oral)   Resp 16   Ht 5\' 6"  (1.676 m)   Wt (!) 143.8 kg   SpO2 100%   BMI 51.17 kg/m   Physical Exam Vitals and nursing note reviewed.  Constitutional:  General: She is not in acute distress.    Appearance: Normal appearance. She is well-developed. She is not ill-appearing, toxic-appearing or diaphoretic.  HENT:     Head: Normocephalic and atraumatic.     Right Ear: External ear normal.     Left Ear: External ear normal.     Nose: Nose normal. No congestion or rhinorrhea.     Mouth/Throat:     Mouth: Mucous membranes are moist.     Pharynx: Oropharynx is clear.  Eyes:     Conjunctiva/sclera: Conjunctivae normal.  Cardiovascular:     Rate and Rhythm: Normal rate and regular rhythm.     Heart sounds: No murmur heard. Pulmonary:     Effort: Pulmonary effort is normal. No respiratory distress.     Breath sounds: Normal breath sounds. No wheezing or rales.  Abdominal:     Palpations: Abdomen is soft.     Tenderness: There is no abdominal tenderness.  Musculoskeletal:        General: Tenderness present. No swelling, deformity or signs of injury.     Cervical back: Normal range of motion and neck supple.     Right lower leg: No edema.     Left lower leg: No edema.     Comments: Positive straight leg raise test.  Tenderness to posterior aspect of right hip.  Skin:    General: Skin is warm and dry.     Capillary Refill: Capillary refill takes less than 2 seconds.     Coloration: Skin is not jaundiced or pale.  Neurological:     General: No focal deficit present.     Mental Status: She is alert and oriented to person, place, and time.     Cranial Nerves: No cranial nerve deficit.     Sensory: No sensory deficit.     Motor: No weakness.      Coordination: Coordination normal.  Psychiatric:        Mood and Affect: Mood normal.        Behavior: Behavior normal.        Thought Content: Thought content normal.        Judgment: Judgment normal.    ED Results / Procedures / Treatments   Labs (all labs ordered are listed, but only abnormal results are displayed) Labs Reviewed  URINALYSIS, ROUTINE W REFLEX MICROSCOPIC - Abnormal; Notable for the following components:      Result Value   APPearance HAZY (*)    Protein, ur TRACE (*)    Leukocytes,Ua SMALL (*)    Bacteria, UA MANY (*)    Crystals PRESENT (*)    All other components within normal limits  CBC WITH DIFFERENTIAL/PLATELET - Abnormal; Notable for the following components:   WBC 15.6 (*)    Neutro Abs 11.0 (*)    Monocytes Absolute 1.1 (*)    All other components within normal limits  COMPREHENSIVE METABOLIC PANEL - Abnormal; Notable for the following components:   Glucose, Bld 106 (*)    AST 12 (*)    All other components within normal limits    EKG None  Radiology DG Knee 2 Views Right  Result Date: 08/16/2021 CLINICAL DATA:  Right knee pain EXAM: RIGHT KNEE - 1-2 VIEW COMPARISON:  None. FINDINGS: No evidence of fracture, dislocation, or joint effusion. Joint spaces are preserved. No evidence of arthropathy or other focal bone abnormality. Soft tissues are unremarkable. IMPRESSION: Negative. Electronically Signed   By: Davina Poke D.O.   On: 08/16/2021 09:21  US Venous Img Lower Unilateral Right  Result Date: 08/16/2021 CLINICAL DATA:  40 year old female with right leg pain. EXAM: RIGHT LOWER EXTREMITY VENOUS DOPPLER ULTRASOUND TECHNIQUE: Gray-scale sonography with graded compression, as well as color Doppler and duplex ultrasound were performed to evaluate the right lower extremity deep venous systems from the level of the common femoral vein and including the common femoral, femoral, profunda femoral, popliteal and calf veins including the posterior  tibial, peroneal and gastrocnemius veins when visible. Spectral Doppler was utilized to evaluate flow at rest and with distal augmentation maneuvers in the common femoral, femoral and popliteal veins. The contralateral common femoral vein was also evaluated for comparison. COMPARISON:  None. FINDINGS: RIGHT LOWER EXTREMITY Common Femoral Vein: No evidence of thrombus. Normal compressibility, respiratory phasicity and response to augmentation. Central Greater Saphenous Vein: No evidence of thrombus. Normal compressibility and flow on color Doppler imaging. Central Profunda Femoral Vein: No evidence of thrombus. Normal compressibility and flow on color Doppler imaging. Femoral Vein: No evidence of thrombus. Normal compressibility, respiratory phasicity and response to augmentation. Popliteal Vein: No evidence of thrombus. Normal compressibility, respiratory phasicity and response to augmentation. Calf Veins: No evidence of thrombus. Normal compressibility and flow on color Doppler imaging. Venous Reflux:  None. Other Findings:  None. LEFT LOWER EXTREMITY Common Femoral Vein: No evidence of thrombus. Normal compressibility, respiratory phasicity and response to augmentation. IMPRESSION: No evidence of right lower extremity deep venous thrombosis. Marliss Coots, MD [facility] Rad[facility] Electronically Signed   By: Marliss Coots M.D.   On: 08/16/2021 09:05   DG Hip Unilat W or Wo Pelvis 2-3 Views Right  Result Date: 08/16/2021 CLINICAL DATA:  Right hip pain EXAM: DG HIP (WITH OR WITHOUT PELVIS) 2-3V RIGHT COMPARISON:  08/02/2018 FINDINGS: There is no evidence of hip fracture or dislocation. There is no evidence of arthropathy or other focal bone abnormality. IMPRESSION: Negative. Electronically Signed   By: Duanne Guess D.O.   On: 08/16/2021 09:20    Procedures Procedures   Medications Ordered in ED Medications  ketorolac (TORADOL) 15 MG/ML injection 15 mg  (15 mg Intravenous Given 08/16/21 0836)  methocarbamol (ROBAXIN) tablet 1,000 mg (1,000 mg Oral Given 08/16/21 0837)  oxyCODONE-acetaminophen (PERCOCET/ROXICET) 5-325 MG per tablet 2 tablet (2 tablets Oral Given 08/16/21 1191)    ED Course  I have reviewed the triage vital signs and the nursing notes.  Pertinent labs & imaging results that were available during my care of the patient were reviewed by me and considered in my medical decision making (see chart for details).    MDM Rules/Calculators/A&P                          Patient presents for 2 days of persistent right hip pain that radiates down her proximal right leg.  Positive straight leg test on exam.  No areas of numbness or weakness.  History and exam findings consistent with sciatica.  Will treat with multimodal pain control.  Additionally, labs, x-ray, and ultrasound studies ordered to evaluate for alternative etiologies.  X-ray and DVT studies were negative.  Patient's lab work showed leukocytosis, which is a chronic condition for her.  On reassessment, patient's pain improved.  Prescriptions given for meloxicam and Robaxin.  She was advised to follow-up with sports medicine/orthopedic surgery for ongoing management.  Patient was discharged in good condition. Final Clinical Impression(s) / ED Diagnoses Final diagnoses:  Sciatica, right side    Rx /  DC Orders ED Discharge Orders          Ordered    meloxicam (MOBIC) 7.5 MG tablet  Daily        08/16/21 0936    methocarbamol (ROBAXIN) 500 MG tablet  Every 8 hours PRN        08/16/21 0936             Godfrey Pick, MD 08/16/21 [phone]

## 2021-08-16 NOTE — Discharge Instructions (Signed)
There are prescriptions sent to your pharmacy for meloxicam as well as methocarbamol.  Meloxicam as an anti-inflammatory pain medication.  Take this medication daily instead of other NSAIDs (ibuprofen, Advil, Aleve).  Methocarbamol is a muscle relaxer.  Take as needed.  Continue daily movement, within the limits of your pain.  There is a number below to call for Ortho care follow-up.  If you experience any new or worsening symptoms, please return to the emergency department.

## 2021-08-16 NOTE — ED Triage Notes (Signed)
Onset on Saturday of pain to right side hip and knee.  States feels like muscle spasms (feels tight).  Radiating to back.

## 2021-08-24 ENCOUNTER — Other Ambulatory Visit (HOSPITAL_BASED_OUTPATIENT_CLINIC_OR_DEPARTMENT_OTHER): Payer: Self-pay

## 2021-08-24 ENCOUNTER — Other Ambulatory Visit: Payer: Self-pay

## 2021-08-24 ENCOUNTER — Ambulatory Visit (INDEPENDENT_AMBULATORY_CARE_PROVIDER_SITE_OTHER): Payer: Managed Care, Other (non HMO) | Admitting: Orthopaedic Surgery

## 2021-08-24 DIAGNOSIS — M5441 Lumbago with sciatica, right side: Secondary | ICD-10-CM | POA: Diagnosis not present

## 2021-08-24 MED ORDER — METHOCARBAMOL 500 MG PO TABS
500.0000 mg | ORAL_TABLET | Freq: Three times a day (TID) | ORAL | 0 refills | Status: DC | PRN
  Filled 2021-08-24: qty 20, 7d supply, fill #0

## 2021-08-24 NOTE — Progress Notes (Signed)
Chief Complaint: Lower back pain     History of Present Illness:   [name] is a 40 y.o. female with back pain radiating down the low back to the right thigh going on for 1 week prior.  She did not have any specific injury.  She states that this is previously happened in the past which point she saw a chiropractor which did help.  She was previously needing a cane for the pain.  She is endorsing some tightness in the hip.  She has been taking Mobic and Robaxin which is helping significantly.  She has been out of work for a week.  She works as a Engineer, civil (consulting) in clinic clinic.    Surgical History:   None  PMH/PSH/Family History/Social History/Meds/Allergies:    Past Medical History:  Diagnosis Date   Allergy    Anxiety    Deafness in right ear    Depression    Diabetes mellitus without complication (HCC)    Gestational diabetes 03/06/2013   glyburide   Hernia    Hypertension    Morbid obesity (HCC)    Plantar fasciitis    PONV (postoperative nausea and vomiting)    Sleep apnea    Past Surgical History:  Procedure Laterality Date   ABDOMINAL HERNIA REPAIR     CESAREAN SECTION     HERNIA REPAIR     Social History   Socioeconomic History   Marital status: Single    Spouse name: Not on file   Number of children: Not on file   Years of education: Not on file   Highest education level: Not on file  Occupational History   Occupation: LPN    Employer: [employer]  Tobacco Use   Smoking status: Never   Smokeless tobacco: Never  Vaping Use   Vaping Use: Never used  Substance and Sexual Activity   Alcohol use: Yes    Comment: socially   Drug use: No   Sexual activity: Not Currently    Partners: Male    Birth control/protection: Implant  Other Topics Concern   Not on file  Social History Narrative   Not on file   Social Determinants of Health   Financial Resource Strain: Not on file  Food Insecurity: Not on file  Transportation Needs:  Not on file  Physical Activity: Not on file  Stress: Not on file  Social Connections: Not on file   Family History  Problem Relation Age of Onset   Hypothyroidism Mother    Hypertension Mother    Cancer Maternal Grandmother        colon   Dementia Maternal Grandfather    No Known Allergies Current Outpatient Medications  Medication Sig Dispense Refill   albuterol (PROVENTIL HFA;VENTOLIN HFA) 108 (90 BASE) MCG/ACT inhaler Inhale 2 puffs into the lungs every 6 (six) hours as needed for wheezing.     ASPIRIN 81 PO Take by mouth.     busPIRone (BUSPAR) 7.5 MG tablet Take by mouth.     cetirizine (ZYRTEC) 10 MG tablet Take by mouth.     docusate sodium (COLACE) 100 MG capsule Take 100 mg by mouth 2 (two) times daily.     doxycycline (VIBRAMYCIN) 100 MG capsule Take 1 capsule (100 mg total) by mouth 2 (two) times daily. 20 capsule 0   escitalopram (  LEXAPRO) 10 MG tablet Take by mouth.     etonogestrel (NEXPLANON) 68 MG IMPL implant Inject into the skin.     fluconazole (DIFLUCAN) 150 MG tablet Take 1 tablet (150 mg total) by mouth once a week. 2 tablet 0   gabapentin (NEURONTIN) 300 MG capsule   1   hydrochlorothiazide (HYDRODIURIL) 25 MG tablet Take by mouth.     lamoTRIgine (LAMICTAL) 100 MG tablet   0   losartan (COZAAR) 50 MG tablet Take by mouth.     meloxicam (MOBIC) 7.5 MG tablet Take 1 tablet (7.5 mg total) by mouth daily for 14 days. 14 tablet 0   methocarbamol (ROBAXIN) 500 MG tablet Take 1 tablet (500 mg total) by mouth every 8 (eight) hours as needed for muscle spasms. 20 tablet 0   metoprolol tartrate (LOPRESSOR) 25 MG tablet Take 25 mg by mouth 2 (two) times daily.     oxyCODONE-acetaminophen (PERCOCET/ROXICET) 5-325 MG per tablet Take by mouth every 4 (four) hours as needed for severe pain.     pravastatin (PRAVACHOL) 80 MG tablet Take 80 mg by mouth daily.     Prenatal Vit-Fe Fumarate-FA (PRENATAL MULTIVITAMIN) TABS Take 1 tablet by mouth daily at 12 noon.     Probiotic  Product (PROBIOTIC PO) Take by mouth.     Semaglutide (OZEMPIC, 0.25 OR 0.5 MG/DOSE, [place]) Inject into the skin.     tizanidine (ZANAFLEX) 2 MG capsule Take 1 capsule (2 mg total) by mouth 3 (three) times daily. 10 capsule 0   No current facility-administered medications for this visit.   No results found.  Review of Systems:   A ROS was performed including pertinent positives and negatives as documented in the HPI.  Physical Exam :   Constitutional: NAD and appears stated age Neurological: Alert and oriented Psych: Appropriate affect and cooperative There were no vitals taken for this visit.   Comprehensive Musculoskeletal Exam:    Tenderness palpation about the right lower back.  She is able to do a straight leg raise with the right leg.  Sensation is intact in all distributions of the right lower extremity.  Is able to dorsiflex plantarflex with full strength.  Reflexes are equal and symmetric.  Imaging:   Xray (right hip 2 view, right knee 4 view): Normal   I personally reviewed and interpreted the radiographs.   Assessment:   40 year old female with lower back pain with radiation down the right thigh for the last week that has occurred atraumatically.  This is continuing to improve at this time.  I recommended that she continue the Robaxin in conjunction with a series of lower back stretching and strengthening exercises which I showed her.  She will perform these at home.  She will wean off meloxicam as tolerated.  Plan :    -She will follow-up with [place] as needed   I personally saw and evaluated the patient, and participated in the management and treatment plan.  Huel Cote, MD Attending Physician, Orthopedic Surgery  This document was dictated using Dragon voice recognition software. A reasonable attempt at proof reading has been made to minimize errors.

## 2021-09-02 DIAGNOSIS — E559 Vitamin D deficiency, unspecified: Secondary | ICD-10-CM | POA: Insufficient documentation

## 2021-09-21 ENCOUNTER — Telehealth: Payer: Self-pay | Admitting: Orthopaedic Surgery

## 2021-09-21 NOTE — Telephone Encounter (Signed)
Please advise pts work status. We've received a form and need current work status and/or work restrictions if any.  Thanks!

## 2021-09-21 NOTE — Telephone Encounter (Signed)
Ov note faxed with form

## 2021-10-28 ENCOUNTER — Ambulatory Visit: Payer: Managed Care, Other (non HMO) | Admitting: Obstetrics and Gynecology

## 2021-12-06 ENCOUNTER — Encounter: Payer: Managed Care, Other (non HMO) | Admitting: Family Medicine

## 2022-03-03 ENCOUNTER — Ambulatory Visit (INDEPENDENT_AMBULATORY_CARE_PROVIDER_SITE_OTHER): Payer: Managed Care, Other (non HMO) | Admitting: Family Medicine

## 2022-03-03 ENCOUNTER — Other Ambulatory Visit (HOSPITAL_COMMUNITY)
Admission: RE | Admit: 2022-03-03 | Discharge: 2022-03-03 | Disposition: A | Discharge status: Home / Self Care | Payer: Managed Care, Other (non HMO) | Source: Ambulatory Visit | Attending: Family Medicine | Admitting: Family Medicine

## 2022-03-03 ENCOUNTER — Encounter: Payer: Self-pay | Admitting: Family Medicine

## 2022-03-03 VITALS — BP 135/72 | HR 95 | Ht 67.0 in | Wt 306.0 lb

## 2022-03-03 DIAGNOSIS — Z113 Encounter for screening for infections with a predominantly sexual mode of transmission: Secondary | ICD-10-CM | POA: Diagnosis not present

## 2022-03-03 DIAGNOSIS — N898 Other specified noninflammatory disorders of vagina: Secondary | ICD-10-CM | POA: Insufficient documentation

## 2022-03-03 DIAGNOSIS — B9689 Other specified bacterial agents as the cause of diseases classified elsewhere: Secondary | ICD-10-CM | POA: Diagnosis not present

## 2022-03-03 DIAGNOSIS — N760 Acute vaginitis: Secondary | ICD-10-CM | POA: Insufficient documentation

## 2022-03-03 NOTE — Progress Notes (Signed)
   Subjective:    Patient ID: [name] is a 41 y.o. female presenting with Vaginal Irritation  on 03/03/2022  HPI: Has new partner since Februaru. No cycle with Nexplanon. Feels achy, feels like something is falling out. Has had STD and UTI testing. 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 Notes urge incontinence.  Review of Systems  Constitutional:  Negative for chills and fever.  Respiratory:  Negative for shortness of breath.   Cardiovascular:  Negative for chest pain.  Gastrointestinal:  Negative for abdominal pain, nausea and vomiting.  Genitourinary:  Negative for dysuria.  Skin:  Negative for rash.      Objective:    BP 135/72   Pulse 95   Ht 5\' 7"  (1.702 m)   Wt (!) 306 lb (138.8 kg)   BMI 47.93 kg/m  Physical Exam Exam conducted with a chaperone present.  Constitutional:      General: She is not in acute distress.    Appearance: She is well-developed.  HENT:     Head: Normocephalic and atraumatic.  Eyes:     General: No scleral icterus. Cardiovascular:     Rate and Rhythm: Normal rate.  Pulmonary:     Effort: Pulmonary effort is normal.  Abdominal:     Palpations: Abdomen is soft.  Genitourinary:    Comments: NEFG, no hypo/hyperpigmentation, no lesions, small cystocele, vagina is pink and rugated, cervix is parous without lesion, uterus is small and anteverted, no adnexal mass or tenderness.   Musculoskeletal:     Cervical back: Neck supple.  Skin:    General: Skin is warm and dry.  Neurological:     Mental Status: She is alert and oriented to person, place, and time.         Assessment & Plan:  Vaginal irritation - Check wet prep and treat appropriately. If negative, consider possible vaginal steroid topically to see if this helps. - Plan: Cervicovaginal ancillary only( [place])   Return if symptoms worsen or fail to improve.  , MD 03/03/2022 3:42 PM

## 2022-03-07 LAB — CERVICOVAGINAL ANCILLARY ONLY
Bacterial Vaginitis (gardnerella): POSITIVE — AB
Candida Glabrata: NEGATIVE
Candida Vaginitis: NEGATIVE
Chlamydia: NEGATIVE
Comment: NEGATIVE
Comment: NEGATIVE
Comment: NEGATIVE
Comment: NEGATIVE
Comment: NEGATIVE
Comment: NORMAL
Neisseria Gonorrhea: NEGATIVE
Trichomonas: NEGATIVE

## 2022-03-08 MED ORDER — METRONIDAZOLE 500 MG PO TABS: 500.0000 mg | ORAL_TABLET | Freq: Two times a day (BID) | ORAL | 0 refills | Status: DC

## 2022-03-08 NOTE — Progress Notes (Signed)
Wetprep c/w BV--rx sent in

## 2022-03-08 NOTE — Addendum Note (Signed)
Addended by: Reva Bores on: 03/08/2022 11:13 AM   Modules accepted: Orders

## 2022-12-05 ENCOUNTER — Ambulatory Visit (INDEPENDENT_AMBULATORY_CARE_PROVIDER_SITE_OTHER): Payer: Managed Care, Other (non HMO) | Admitting: Obstetrics & Gynecology

## 2022-12-05 ENCOUNTER — Other Ambulatory Visit (HOSPITAL_COMMUNITY)
Admission: RE | Admit: 2022-12-05 | Discharge: 2022-12-05 | Disposition: A | Discharge status: Home / Self Care | Payer: Managed Care, Other (non HMO) | Source: Ambulatory Visit | Attending: Obstetrics & Gynecology | Admitting: Obstetrics & Gynecology

## 2022-12-05 ENCOUNTER — Ambulatory Visit (INDEPENDENT_AMBULATORY_CARE_PROVIDER_SITE_OTHER): Payer: Managed Care, Other (non HMO)

## 2022-12-05 ENCOUNTER — Encounter: Payer: Self-pay | Admitting: Obstetrics & Gynecology

## 2022-12-05 VITALS — BP 130/74 | HR 86 | Ht 67.0 in | Wt 312.0 lb

## 2022-12-05 DIAGNOSIS — Z01419 Encounter for gynecological examination (general) (routine) without abnormal findings: Secondary | ICD-10-CM

## 2022-12-05 DIAGNOSIS — Z113 Encounter for screening for infections with a predominantly sexual mode of transmission: Secondary | ICD-10-CM | POA: Insufficient documentation

## 2022-12-05 DIAGNOSIS — Z975 Presence of (intrauterine) contraceptive device: Secondary | ICD-10-CM

## 2022-12-05 DIAGNOSIS — Z3046 Encounter for surveillance of implantable subdermal contraceptive: Secondary | ICD-10-CM

## 2022-12-05 MED ORDER — ESCITALOPRAM OXALATE 10 MG PO TABS: 10.0000 mg | ORAL_TABLET | Freq: Every day | ORAL | 12 refills | Status: DC

## 2022-12-05 NOTE — Progress Notes (Signed)
Subjective:     [name] is a 42 y.o. female here for a routine exam.  Current complaints:  Vaginal heaviness when standing; no pain with sexual activity or physical activity.   Pt has some depression symptoms                             and would like Lexpapro refilled.  She sees a therapist who she likes very much.  No SI/HI.   Gynecologic History No LMP recorded. Patient has had an implant. Contraception: Nexplanon Last Mammogram: 10/03/22- negative; HPV negative Last Pap Smear:  12/02/19- ASCUS Last Colon Screening;  n/a Seat Belts:   yes Sun Screen:   yes Dental Check Up:  yes Brush & Floss:  yes     Obstetric History OB History  Gravida Para Term Preterm AB Living  '2 2 2 '$ 0 0 2  SAB IAB Ectopic Multiple Live Births  0 0 0 0 2    # Outcome Date GA Lbr Len/2nd Weight Sex Delivery Anes PTL Lv  2 Term 07/26/13 [id number] / 04:33 8 lb 4.6 oz (3.76 kg) M Vag-Spont EPI, Local  LIV     Birth Comments: caput  1 Term 01/14/11 391w1d8 lb 8 oz (3.856 kg) M CS-Unspec   LIV     Birth Comments: Prolonged rupture; fetal distress     The following portions of the patient's history were reviewed and updated as appropriate: allergies, current medications, past family history, past medical history, past social history, past surgical history, and problem list.  Review of Systems Pertinent items noted in HPI and remainder of comprehensive ROS otherwise negative.    Objective:   Vitals:   12/05/22 1036  BP: 130/74  Pulse: 86  Weight: (!) 312 lb (141.5 kg)  Height: '5\' 7"'$  (1.702 m)   Vitals:  WNL General appearance: alert, cooperative and no distress  HEENT: Normocephalic, without obvious abnormality, atraumatic Eyes: negative Throat: lips, mucosa, and tongue normal; teeth and gums normal  Respiratory: Clear to auscultation bilaterally  CV: Regular rate and rhythm  Breasts:  Normal appearance, no masses or tenderness, no nipple retraction or dimpling  GI: Soft, non-tender;  bowel sounds normal; no masses,  no organomegaly  GU: External Genitalia:  Tanner V, no lesion Urethra:  No prolapse   Vagina: Pink, normal rugae, no blood or discharge  Cervix: No CMT, no lesion  Uterus:  Normal size and contour, non tender, limited by habitus  Adnexa: Normal, no masses, non tender, limited by habitus  Musculoskeletal: No edema, redness or tenderness in the calves or thighs; unable to palpate the nexplanon in the left arm--will need [place] Skin: No lesions or rash  Lymphatic: Axillary adenopathy: none     Psychiatric: Normal mood and behavior    Assessment:    Healthy female exam.    Plan:      Pap with co testing Vaginal heaviness--aptima for vaginitis STD screening per patient preference Nexplanon--not palpated--will get [place] today and mark where the Nexplanon is.  If not able to palpate, may need to send to General Surgery to remove. Depression--refill Lexapro; continue counseling.

## 2022-12-05 NOTE — Progress Notes (Signed)
Last Mammogram: 10/03/22- negative Last Pap Smear:  12/02/19- ASCUS Last Colon Screening;  n/a Seat Belts:   yes Sun Screen:   yes Dental Check Up:  yes Brush & Floss:  yes

## 2022-12-05 NOTE — Addendum Note (Signed)
Addended by: Lyndal Rainbow on: 12/05/2022 04:21 PM   Modules accepted: Orders

## 2022-12-06 LAB — CERVICOVAGINAL ANCILLARY ONLY
Bacterial Vaginitis (gardnerella): NEGATIVE
Candida Glabrata: NEGATIVE
Candida Vaginitis: NEGATIVE
Chlamydia: NEGATIVE
Comment: NEGATIVE
Comment: NEGATIVE
Comment: NEGATIVE
Comment: NEGATIVE
Comment: NEGATIVE
Comment: NORMAL
Neisseria Gonorrhea: NEGATIVE
Trichomonas: NEGATIVE

## 2022-12-06 LAB — HIV ANTIBODY (ROUTINE TESTING W REFLEX): HIV Screen 4th Generation wRfx: NONREACTIVE

## 2022-12-06 LAB — HEPATITIS B SURFACE ANTIGEN: Hepatitis B Surface Ag: NEGATIVE

## 2022-12-06 LAB — RPR: RPR Ser Ql: NONREACTIVE

## 2022-12-06 LAB — HEPATITIS C ANTIBODY: Hep C Virus Ab: NONREACTIVE

## 2022-12-08 LAB — CYTOLOGY - PAP
Comment: NEGATIVE
Diagnosis: NEGATIVE
High risk HPV: NEGATIVE

## 2022-12-27 ENCOUNTER — Ambulatory Visit (INDEPENDENT_AMBULATORY_CARE_PROVIDER_SITE_OTHER): Payer: Managed Care, Other (non HMO) | Admitting: Sports Medicine

## 2022-12-27 ENCOUNTER — Other Ambulatory Visit: Payer: Self-pay | Admitting: Obstetrics & Gynecology

## 2022-12-27 DIAGNOSIS — Z975 Presence of (intrauterine) contraceptive device: Secondary | ICD-10-CM

## 2022-12-27 NOTE — Progress Notes (Signed)
    Procedures performed today:    Procedure: Complex non-palpable foreign body removal with incision. Risks, benefits, and alternatives explained and consent obtained. Time out conducted. Foreign body localized at its proximal and distal ends with ultrasound, skin marked. Surface prepped with alcohol. 5cc lidocaine with epinephine infiltrated in a field block. Adequate anesthesia ensured. Area prepped and draped in a sterile fashion. Excision performed with: I made a stab incision at the distal end, then using blunt dissection and palpation through the incision I was able to palpate the foreign body, it was removed longitudinally, the incision was then closed with three 3-0 simple interrupted Ethilon stitches. Pt stable.  Independent interpretation of notes and tests performed by another provider:   None.  Brief History, Exam, Impression, and Recommendations:    Nexplanon in place Patient referred to me by OB/GYN for surgical removal of a non-palpable Nexplanon. Using ultrasound I was able to see a hyperechoic structure in the subcutaneous tissues of the inner upper arm, the structure casted an acoustic shadow. I marked the edges of the Nexplanon acoustic shadow with a marker. I made a small incision and we were able to remove the Nexplanon successfully, 3 simple interrupted sutures placed, she will return in a week for suture removal.    ____________________________________________ Gwen Her. Dianah Field, M.D., ABFM., CAQSM., AME. Primary Care and Sports Medicine [facility]  Adjunct Professor of [place] [employer] [facility] [employer]  Risk manager

## 2022-12-27 NOTE — Assessment & Plan Note (Addendum)
Patient referred to me by OB/GYN for surgical removal of a non-palpable Nexplanon. Using ultrasound I was able to see a hyperechoic structure in the subcutaneous tissues of the inner upper arm, the structure casted an acoustic shadow. I marked the edges of the Nexplanon acoustic shadow with a marker. I made a small incision and we were able to remove the Nexplanon successfully, 3 simple interrupted sutures placed, she will return in a week for suture removal.

## 2023-01-06 ENCOUNTER — Ambulatory Visit (INDEPENDENT_AMBULATORY_CARE_PROVIDER_SITE_OTHER): Payer: Managed Care, Other (non HMO) | Admitting: Sports Medicine

## 2023-01-06 DIAGNOSIS — Z975 Presence of (intrauterine) contraceptive device: Secondary | ICD-10-CM

## 2023-01-06 NOTE — Progress Notes (Signed)
    Procedures performed today:    None.  Independent interpretation of notes and tests performed by another provider:   None.  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    ____________________________________________ Ihor Austin. Benjamin Stain, M.D., ABFM., CAQSM., AME. Primary Care and Sports Medicine [facility]  Adjunct Professor of Family Medicine  [employer] [facility] [employer]  Restaurant manager, fast food

## 2023-01-06 NOTE — Assessment & Plan Note (Signed)
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

## 2023-01-16 ENCOUNTER — Encounter: Payer: Self-pay | Admitting: Emergency Medicine

## 2023-01-16 ENCOUNTER — Ambulatory Visit: Payer: Managed Care, Other (non HMO) | Admitting: Obstetrics & Gynecology

## 2023-01-16 ENCOUNTER — Ambulatory Visit (INDEPENDENT_AMBULATORY_CARE_PROVIDER_SITE_OTHER): Payer: Managed Care, Other (non HMO)

## 2023-01-16 ENCOUNTER — Encounter: Payer: Self-pay | Admitting: Obstetrics & Gynecology

## 2023-01-16 ENCOUNTER — Ambulatory Visit
Admission: EM | Admit: 2023-01-16 | Discharge: 2023-01-16 | Disposition: A | Discharge status: Home / Self Care | Payer: Managed Care, Other (non HMO) | Attending: Family Medicine | Admitting: Family Medicine

## 2023-01-16 VITALS — BP 112/63 | HR 93 | Temp 98.5°F | Resp 16 | Ht 67.0 in | Wt 313.0 lb

## 2023-01-16 DIAGNOSIS — R0602 Shortness of breath: Secondary | ICD-10-CM

## 2023-01-16 DIAGNOSIS — R059 Cough, unspecified: Secondary | ICD-10-CM | POA: Diagnosis not present

## 2023-01-16 DIAGNOSIS — Z30430 Encounter for insertion of intrauterine contraceptive device: Secondary | ICD-10-CM | POA: Diagnosis not present

## 2023-01-16 DIAGNOSIS — R051 Acute cough: Secondary | ICD-10-CM

## 2023-01-16 DIAGNOSIS — J069 Acute upper respiratory infection, unspecified: Secondary | ICD-10-CM | POA: Diagnosis not present

## 2023-01-16 LAB — POCT FASTING CBG KUC MANUAL ENTRY: POCT Glucose (KUC): 98 mg/dL (ref 70–99)

## 2023-01-16 LAB — POCT URINE PREGNANCY: Preg Test, Ur: NEGATIVE

## 2023-01-16 MED ORDER — PROMETHAZINE-DM 6.25-15 MG/5ML PO SYRP: 5.0000 mL | ORAL_SOLUTION | Freq: Four times a day (QID) | ORAL | 0 refills | Status: DC | PRN

## 2023-01-16 MED ORDER — ALBUTEROL SULFATE HFA 108 (90 BASE) MCG/ACT IN AERS: 1.0000 | INHALATION_SPRAY | Freq: Four times a day (QID) | RESPIRATORY_TRACT | 0 refills | Status: AC | PRN

## 2023-01-16 MED ORDER — AMOXICILLIN 875 MG PO TABS: 875.0000 mg | ORAL_TABLET | Freq: Two times a day (BID) | ORAL | 0 refills | Status: DC

## 2023-01-16 MED ORDER — LEVONORGESTREL 20 MCG/DAY IU IUD
1.0000 | INTRAUTERINE_SYSTEM | Freq: Once | INTRAUTERINE | Status: AC
Start: 2023-01-16 — End: 2023-01-16
  Administered 2023-01-16: 1 via INTRAUTERINE

## 2023-01-16 NOTE — Progress Notes (Signed)
   Subjective:    Patient ID: [name], female    DOB: 07-22-1981, 42 y.o.   MRN: [id number]  HPI  Pt had Nexplanon removed with IUD guidance by Dr. Karie Schwalbe.  Here for IUD insertion.  Had "muscle spasms" after Nexplanon removed--now resolved.  Unsure what caused that feeling.  She will f/u with PCP if returns.   Review of Systems  Constitutional: Negative.   Respiratory: Negative.    Cardiovascular: Negative.   Gastrointestinal: Negative.   Genitourinary: Negative.        Objective:   Physical Exam Vitals reviewed.  Constitutional:      General: She is not in acute distress.    Appearance: She is well-developed.  HENT:     Head: Normocephalic and atraumatic.  Eyes:     Conjunctiva/sclera: Conjunctivae normal.  Cardiovascular:     Rate and Rhythm: Normal rate.  Pulmonary:     Effort: Pulmonary effort is normal.  Skin:    General: Skin is warm and dry.  Neurological:     Mental Status: She is alert and oriented to person, place, and time.  Psychiatric:        Mood and Affect: Mood normal.    Vitals:   01/16/23 0912  BP: 112/63  Pulse: 93  Resp: 16  Temp: 98.5 F (36.9 C)  Weight: (!) 142 kg  Height:  (1.702 m)       Assessment & Plan:  42 yo female for IUD insertion.  Muscle spasms s/p Nexplanon removal--likely unrelated.   IUD Procedure Note Patient identified, informed consent performed.  Discussed risks of irregular bleeding, cramping, infection, malpositioning or misplacement of the IUD outside the uterus which may require further procedures. Time out was performed.  Urine pregnancy test negative.  Speculum placed in the vagina.  Cervix visualized.  Cleaned with Betadine x 2.  Grasped anteriorly with a single tooth tenaculum.  Uterus sounded to 7 cm.  Mirena IUD placed per manufacturer's recommendations.  Strings trimmed to 3 cm. Tenaculum was removed, good hemostasis noted.  Patient tolerated procedure well.   Patient was given post-procedure  instructions and the Mirena care card with expiration date.  Patient was also asked to check IUD strings periodically and follow up in 4-6 weeks for IUD check.

## 2023-01-16 NOTE — ED Triage Notes (Signed)
Cough, nasal drainage x 1 week. Noticed she now can't lay flat due to cough/SOB and feels like she's urinating more frequently. Reports increased fatigue, appears out of breath after walking from waiting room to exam room. Does report SOB at rest. Patient is 312 lb. States she feels as though her legs are swollen, but doesn't recall when they began feeling that way. Also reports increased leg cramping. Has been taking zyrtec, advil sinus congestion and pain, flonase, as well as guiafenesin.

## 2023-01-16 NOTE — Discharge Instructions (Addendum)
See your doctor for the leg swelling, and for feeling of urinary frequency.  Your blood sugar is normal today at 98  The cough is caused by a bronchitis.  These are usually viruses.  When they persist we treat them with an antibiotic.  Your chest x-ray is normal  Use your albuterol inhaler as needed for shortness of breath  Drink lots of fluids.  Take antibiotic as prescribed.  And I have prescribed a cough medicine for you

## 2023-01-16 NOTE — ED Provider Notes (Signed)
[name]    CSN: [id number] Arrival date & time: 01/16/23  1011      History   Chief Complaint Chief Complaint  Patient presents with   Cough    HPI [name] is a 42 y.o. female.   HPI  Patient states that she has been sick for about a week.  She has cough and nasal drainage.  She states that she is more short of breath.  She cannot lay flat because of the shortness of breath.  sHe is coughing up thick green sputum.  It hurts to take a deep breath and cough.  She is also short of breath if she tries to walk across the room.  Patient states her legs are also more swollen.  Does not know how long they have been looking this way.  Patient states he also has had more urinary frequency and thirst.  Has a history of prediabetes  Past Medical History:  Diagnosis Date   Allergy    Anxiety    Deafness in right ear    Depression    Diabetes mellitus without complication    Gestational diabetes 03/06/2013   glyburide   Hernia    Hypertension    Morbid obesity    Plantar fasciitis    PONV (postoperative nausea and vomiting)    Sleep apnea     Patient Active Problem List   Diagnosis Date Noted   Chronic back pain 11/30/2020   Chronic knee pain 11/30/2020   Morbid obesity with BMI of 45.0-49.9, adult (HCC) 12/12/2019   Abnormal uterine bleeding (AUB) 12/12/2019   Nexplanon in place 12/12/2019   Diabetes mellitus without complication    Hypertension    Anxiety 03/30/2018   Depressive disorder 03/30/2018    Past Surgical History:  Procedure Laterality Date   ABDOMINAL HERNIA REPAIR     CESAREAN SECTION     HERNIA REPAIR      OB History     Gravida  2   Para  2   Term  2   Preterm  0   AB  0   Living  2      SAB  0   IAB  0   Ectopic  0   Multiple  0   Live Births  2            Home Medications    Prior to Admission medications   Medication Sig Start Date End Date Taking? Authorizing Provider  albuterol  (VENTOLIN HFA) 108 (90 Base) MCG/ACT inhaler Inhale 1-2 puffs into the lungs every 6 (six) hours as needed for wheezing or shortness of breath. 01/16/23  Yes Eustace Moore, MD  amoxicillin (AMOXIL) 875 MG tablet Take 1 tablet (875 mg total) by mouth 2 (two) times daily. 01/16/23  Yes Eustace Moore, MD  ASPIRIN 81 PO Take by mouth.   Yes [provider]  cetirizine (ZYRTEC) 10 MG tablet Take by mouth.   Yes [provider]  diltiazem (CARDIZEM CD) 120 MG 24 hr capsule Take 120 mg by mouth daily.   Yes [provider]  escitalopram (LEXAPRO) 10 MG tablet Take 1 tablet (10 mg total) by mouth daily. 12/05/22  Yes Lesly Dukes, MD  losartan (COZAAR) 50 MG tablet Take by mouth. 12/12/19  Yes [provider]  meloxicam (MOBIC) 15 MG tablet Take 15 mg by mouth daily. 01/02/23  Yes [provider]  pravastatin (PRAVACHOL) 80 MG tablet Take 80 mg  by mouth daily.   Yes [provider]  Probiotic Product (PROBIOTIC PO) Take by mouth.   Yes [provider]  promethazine-dextromethorphan (PROMETHAZINE-DM) 6.25-15 MG/5ML syrup Take 5 mLs by mouth 4 (four) times daily as needed for cough. 01/16/23  Yes Eustace Moore, MD  Semaglutide (OZEMPIC, 0.25 OR 0.5 MG/DOSE, [place]) Inject into the skin.   Yes [provider]  topiramate (TOPAMAX) 50 MG tablet Take by mouth. 07/01/22  Yes [provider]  docusate sodium (COLACE) 100 MG capsule Take 100 mg by mouth 2 (two) times daily.    [provider]    Family History Family History  Problem Relation Age of Onset   Hypothyroidism Mother    Hypertension Mother    Breast cancer Mother 86   Colon cancer Maternal Grandmother 9       colon   Dementia Maternal Grandfather     Social History Social History   Tobacco Use   Smoking status: Never   Smokeless tobacco: Never  Vaping Use   Vaping Use: Never used  Substance Use Topics   Alcohol use: Yes    Alcohol/week:  4.0 standard drinks of alcohol    Types: 4 Glasses of wine per week    Comment: socially   Drug use: No     Allergies   Patient has no known allergies.   Review of Systems Review of Systems  See HPI Physical Exam Triage Vital Signs ED Triage Vitals  Enc Vitals Group     BP 01/16/23 1017 118/81     Pulse Rate 01/16/23 1017 85     Resp 01/16/23 1017 16     Temp 01/16/23 1017 97.6 F (36.4 C)     Temp Source 01/16/23 1017 Oral     SpO2 01/16/23 1017 99 %     Weight --      Height --      Head Circumference --      Peak Flow --      Pain Score 01/16/23 1024 4     Pain Loc --      Pain Edu? --      Excl. in GC? --    No data found.  Updated Vital Signs BP 118/81 (BP Location: Left Arm)   Pulse 85   Temp 97.6 F (36.4 C) (Oral)   Resp 16   SpO2 99%       Physical Exam Constitutional:      General: She is not in acute distress.    Appearance: She is well-developed.  HENT:     Head: Normocephalic and atraumatic.     Right Ear: Tympanic membrane and ear canal normal.     Left Ear: Tympanic membrane and ear canal normal.     Nose: Nose normal. No rhinorrhea.     Mouth/Throat:     Mouth: Mucous membranes are moist.     Pharynx: No posterior oropharyngeal erythema.  Eyes:     Conjunctiva/sclera: Conjunctivae normal.     Pupils: Pupils are equal, round, and reactive to light.  Cardiovascular:     Rate and Rhythm: Normal rate and regular rhythm.     Heart sounds: Normal heart sounds.  Pulmonary:     Effort: Pulmonary effort is normal. No respiratory distress.     Comments: Diminished breath sounds Abdominal:     General: There is no distension.     Palpations: Abdomen is soft.  Musculoskeletal:        General: Normal  range of motion.     Cervical back: Normal range of motion.     Right lower leg: Edema present.     Left lower leg: Edema present.     Comments: Trace edema bilaterally at the ankles  Lymphadenopathy:     Cervical: No cervical adenopathy.   Skin:    General: Skin is warm and dry.  Neurological:     Mental Status: She is alert.     Gait: Gait normal.      UC Treatments / Results  Labs (all labs ordered are listed, but only abnormal results are displayed) Labs Reviewed  POCT FASTING [facility] MANUAL ENTRY    EKG   Radiology DG Chest 2 View  Result Date: 01/16/2023 CLINICAL DATA:  Shortness of breath.  Cough. EXAM: CHEST - 2 VIEW COMPARISON:  08/02/2018 FINDINGS: The heart size and mediastinal contours are within normal limits. Both lungs are clear. The visualized skeletal structures are unremarkable. IMPRESSION: No active cardiopulmonary disease. Electronically Signed   By: Kennith Center M.D.   On: 01/16/2023 11:32    Procedures Procedures (including critical care time)  Medications Ordered in UC Medications - No data to display  Initial Impression / Assessment and Plan / UC Course  I have reviewed the triage vital signs and the nursing notes.  Pertinent labs & imaging results that were available during my care of the patient were reviewed by me and considered in my medical decision making (see chart for details).    I discussed with the patient that her urinary symptoms, negative sugar, pedal edema or chronic medical problems to be addressed by her PCP.  Her current cough and shortness of breath are likely due to respiratory infection.  I explained that most of these are viral however with her persistent symptoms we will cover with antibiotics Final Clinical Impressions(s) / UC Diagnoses   Final diagnoses:  Viral URI with cough  Acute cough  Shortness of breath     Discharge Instructions      See your doctor for the leg swelling, and for feeling of urinary frequency.  Your blood sugar is normal today at 98  The cough is caused by a bronchitis.  These are usually viruses.  When they persist we treat them with an antibiotic.  Your chest x-ray is normal  Use your albuterol inhaler as needed for  shortness of breath  Drink lots of fluids.  Take antibiotic as prescribed.  And I have prescribed a cough medicine for you     ED Prescriptions     Medication Sig Dispense Auth. Provider   promethazine-dextromethorphan (PROMETHAZINE-DM) 6.25-15 MG/5ML syrup Take 5 mLs by mouth 4 (four) times daily as needed for cough. 118 mL Eustace Moore, MD   amoxicillin (AMOXIL) 875 MG tablet Take 1 tablet (875 mg total) by mouth 2 (two) times daily. 14 tablet Eustace Moore, MD   albuterol (VENTOLIN HFA) 108 (90 Base) MCG/ACT inhaler Inhale 1-2 puffs into the lungs every 6 (six) hours as needed for wheezing or shortness of breath. 18 g Eustace Moore, MD      PDMP not reviewed this encounter.   Eustace Moore, MD 01/16/23 [phone]

## 2023-01-17 ENCOUNTER — Telehealth: Payer: Self-pay | Admitting: Emergency Medicine

## 2023-01-17 NOTE — Telephone Encounter (Signed)
Call to pt to see how she was today - pt stated she did get some rest w/ cough medicine, but [facility] stated her insurance did not cover the albuterol ordered. This RN will call pharmacy & follow up with patient. Pharmacy confirmed that albuterol is not covered under patients insurance. Xopenex 2 puffs  q 6 hrs prn SOB or wheezing. No refills.  Telephone order RBV by Dr Delton See to C. Valton Schwartz, RN to pharmacist. Follow up call to patient to update on new medication. Pt will call back to [facility] if she is not able to get medication

## 2023-03-02 ENCOUNTER — Ambulatory Visit: Payer: Managed Care, Other (non HMO) | Admitting: Obstetrics & Gynecology

## 2023-03-02 ENCOUNTER — Encounter: Payer: Self-pay | Admitting: Obstetrics & Gynecology

## 2023-03-02 VITALS — BP 142/84 | HR 80 | Ht 67.0 in | Wt 320.0 lb

## 2023-03-02 DIAGNOSIS — Z30431 Encounter for routine checking of intrauterine contraceptive device: Secondary | ICD-10-CM | POA: Diagnosis not present

## 2023-03-02 NOTE — Progress Notes (Signed)
Pt didn't take BP meds today

## 2023-03-02 NOTE — Progress Notes (Signed)
    GYNECOLOGY OFFICE ENCOUNTER NOTE  History:  42 y.o. [id number] here today for today for IUD string check; Mirena  IUD was placed  01/16/23.   Having expected breakthrough bleeding.  No other complaints about the IUD, no concerning side effects.  The following portions of the patient's history were reviewed and updated as appropriate: allergies, current medications, past family history, past medical history, past social history, past surgical history and problem list. Last pap smear on 12/05/2022 was normal, negative HRHPV.  Review of Systems:  Pertinent items are noted in HPI.   Objective:  Physical Exam Blood pressure (!) 156/84, pulse 92, height 5\' 7"  (1.702 m), weight (!) 320 lb (145.2 kg). CONSTITUTIONAL: Well-developed, well-nourished female in no acute distress.  NEUROLOGIC: Alert and oriented to person, place, and time. Normal reflexes, muscle tone coordination.  PSYCHIATRIC: Normal mood and affect. Normal behavior. Normal judgment and thought content. CARDIOVASCULAR: Normal heart rate noted RESPIRATORY: Effort and breath sounds normal, no problems with respiration noted ABDOMEN: Soft, no distention noted.   PELVIC: Normal appearing external genitalia; normal appearing vaginal mucosa and cervix.  IUD strings visualized, about 2 cm in length outside cervix. Done in the presence of a chaperone.   Assessment & Plan:  Patient to keep IUD in place for up to five to eight years; can come in for removal earlier if she desires or for any concerning side effects. Will follow up with PCP about her HTN.   Jaynie Collins, MD, FACOG Obstetrician & Gynecologist, [facility] for [employer], [facility]

## 2023-06-08 ENCOUNTER — Other Ambulatory Visit: Payer: Self-pay | Admitting: *Deleted

## 2023-06-08 MED ORDER — ESCITALOPRAM OXALATE 10 MG PO TABS: 10.0000 mg | ORAL_TABLET | Freq: Every day | ORAL | 1 refills | Status: DC

## 2023-12-09 ENCOUNTER — Other Ambulatory Visit: Payer: Self-pay | Admitting: Obstetrics & Gynecology

## 2024-04-11 ENCOUNTER — Other Ambulatory Visit: Payer: Self-pay

## 2024-04-11 ENCOUNTER — Encounter: Payer: Self-pay | Admitting: Allergy

## 2024-04-11 ENCOUNTER — Ambulatory Visit: Payer: Self-pay | Admitting: Allergy

## 2024-04-11 VITALS — BP 136/88 | HR 83 | Temp 98.6°F | Resp 18 | Ht 66.25 in | Wt 316.8 lb

## 2024-04-11 DIAGNOSIS — L509 Urticaria, unspecified: Secondary | ICD-10-CM | POA: Diagnosis not present

## 2024-04-11 DIAGNOSIS — J3089 Other allergic rhinitis: Secondary | ICD-10-CM

## 2024-04-11 DIAGNOSIS — R0609 Other forms of dyspnea: Secondary | ICD-10-CM | POA: Diagnosis not present

## 2024-04-11 DIAGNOSIS — D72829 Elevated white blood cell count, unspecified: Secondary | ICD-10-CM | POA: Diagnosis not present

## 2024-04-11 DIAGNOSIS — Z713 Dietary counseling and surveillance: Secondary | ICD-10-CM

## 2024-04-11 NOTE — Patient Instructions (Addendum)
 Inflammation Usually allergies don't cause elevated white blood cells.  Rhinitis  Return for allergy skin testing. Will make additional recommendations based on results. Make sure you don't take any antihistamines for 3 days before the skin testing appointment. Don't put any lotion on the back and arms on the day of testing.  Must be in good health and not ill. No vaccines/injections/antibiotics within the past 7 days.  Plan on being here for 30-60 minutes.  Hives  Keep track of rashes and take pictures. Write down what you had done during flares.  See below for proper skin care. Use fragrance free and dye free products. No dryer sheets or fabric softener.    May take zyrtec (cetirizine) 10mg  OR allegra (fexofenadine) 180mg  twice a day. If symptoms are not controlled or causes drowsiness let us  know. Avoid the following potential triggers: alcohol, tight clothing, NSAIDs, hot showers and getting overheated. See below for proper skin care.   Shortness of breath Normal breathing test today. Sounds like your shortness of breath with exertion is mainly due to physical deconditioning.  During infections:  May use albuterol  rescue inhaler 2 puffs every 4 to 6 hours as needed for shortness of breath, chest tightness, coughing, and wheezing.  Monitor frequency of use - if you need to use it more than twice per week on a consistent basis let us  know.   Foods/gluten You are tolerating all foods with no issues. There is no indication for any food testing. If you want to try a gluten free diet to see if you feel better that is up to you. But I don't think you have any food allergies.   Follow up for skin testing.    Skin care recommendations  Bath time: Always use lukewarm water. AVOID very hot or cold water. Keep bathing time to 5-10 minutes. Do NOT use bubble bath. Use a mild soap and use just enough to wash the dirty areas. Do NOT scrub skin vigorously.  After bathing, pat dry  your skin with a towel. Do NOT rub or scrub the skin.  Moisturizers and prescriptions:  ALWAYS apply moisturizers immediately after bathing (within 3 minutes). This helps to lock-in moisture. Use the moisturizer several times a day over the whole body. Good summer moisturizers include: Aveeno, CeraVe, Cetaphil. Good winter moisturizers include: Aquaphor, Vaseline, Cerave, Cetaphil, Eucerin, Vanicream. When using moisturizers along with medications, the moisturizer should be applied about one hour after applying the medication to prevent diluting effect of the medication or moisturize around where you applied the medications. When not using medications, the moisturizer can be continued twice daily as maintenance.  Laundry and clothing: Avoid laundry products with added color or perfumes. Use unscented hypo-allergenic laundry products such as Tide free, Cheer free & gentle, and All free and clear.  If the skin still seems dry or sensitive, you can try double-rinsing the clothes. Avoid tight or scratchy clothing such as wool. Do not use fabric softeners or dyer sheets.

## 2024-04-11 NOTE — Progress Notes (Signed)
 New Patient Note  RE: [name] MRN: [id number] DOB: 07-02-1981 Date of Office Visit: 04/11/2024  Consult requested by: Rena Luke POUR, MD Primary care provider: Rena Luke POUR, MD  Chief Complaint: Establish Care (Possible allergys hives daily on zyrtec during fall and spring. Increased wbc)  History of Present Illness: I had the pleasure of seeing [name] for initial evaluation at the [facility] on 04/11/2024. She is a 43 y.o. female, who is self-referred here for the evaluation of inflammation and allergies.   Discussed the use of AI scribe software for clinical note transcription with the patient, who gave verbal consent to proceed.    [name] is a 43 year old female who presents with concerns about inflammation and potential food allergies.  She experiences inflammation, which she associates with an increased white blood cell count. She is uncertain if this is related to food allergies or gluten sensitivity. She has not previously undergone allergy testing for environmental or food allergies.  She experiences shortness of breath, particularly during exercise, which resolves within five to ten minutes of rest. She does not use daily inhalers, but has used inhalers when sick, which she finds helpful. No wheezing is reported.  She has symptoms of allergies such as itchy, watery eyes, sneezing, and a runny nose, which occur year-round but are more prominent in the mornings. She takes allergy medication daily, which she finds beneficial, although she sometimes forgets to take it.  She experiences hives when she exercises, especially if she forgets to take her allergy medication. The hives resolve within a couple of hours. She denies any known medication allergies, beesting allergies, or eczema.   She reports issues with constipation and takes over-the-counter medication to manage it. She is on medication for high blood pressure, high cholesterol,  weight loss, anxiety, and depression.     Patient has increased WBC - follows with hematology.   01/03/2024 heme/onc visit: IMPRESSION / PLAN:  [name] is a 43 y.o. female who presents for follow up.   Leukocytosis: In this case, it is likely related to chronic inflammation NOS and likely the elevated BMI. On 08/24/2021 I personally reviewed a peripheral smear and It was unremarkable.  -Today CBC shows a white count of 13.9. It is associated with an elevated neutrophil count, elevated lymphocyte count and elevated monocyte count - We will monitor   Assessment and Plan: [name] is a 43 y.o. female with: Leukocytosis, unspecified type Patient concerned about allergies (environmental and foods) causing her inflammation and leukocytosis. Denies any IgE mediated symptoms after eating. Followed by heme/onc as well. It is unlikely with her clinical history that allergies are causing her leukocytosis. Eosinophils were only 170.   Dietary counseling and surveillance Tolerating all food groups with no issues. There is no indication for any food testing. If you want to try a gluten free diet to see if you feel better that is up to you. But I don't think you have any food allergies.   Dyspnea on exertion Shortness of breath with exertion that resolves within 5-10 minutes without any intervention. Sometime uses albuterol  during infections. Today's spirometry was normal. The shortness of breath is most likely due to physical deconditioning. During infections:  May use albuterol  rescue inhaler 2 puffs every 4 to 6 hours as needed for shortness of breath, chest tightness, coughing, and wheezing.  Monitor frequency of use - if you need to use it more than twice per week  on a consistent basis let us  know.   Other allergic rhinitis Perennial symptoms managed by daily zyrtec. No prior allergy testing.  Return for allergy skin testing. Will make additional recommendations based on  results.  Urticaria Exercise-induced urticaria controlled with daily cetirizine. Keep track of rashes and take pictures. Write down what you had done during flares.  May take zyrtec (cetirizine) 10mg  OR allegra (fexofenadine) 180mg  twice a day. If symptoms are not controlled or causes drowsiness let us  know. Avoid the following potential triggers: alcohol, tight clothing, NSAIDs, hot showers and getting overheated. See below for proper skin care.   Return for Skin testing.  No orders of the defined types were placed in this encounter.  Lab Orders  No laboratory test(s) ordered today    Other allergy screening: Asthma: Gets shortness of breath mainly with activity and symptoms resolve within 5-10 min. No wheezing or coughing. Uses albuterol  during infection with good benefit.   Rhino conjunctivitis: yes Takes zyrtec daily with good benefit. No prior allergy testing.   Food allergy: no Medication allergy: no Hymenoptera allergy: no Urticaria: yes Breaks out in hives with exertion.   Eczema:no History of recurrent infections suggestive of immunodeficency: no  Diagnostics: Spirometry:  Tracings reviewed. Her effort: Good reproducible efforts. FVC: 2.94L FEV1: 2.34L, 85% predicted FEV1/FVC ratio: 80% Interpretation: Spirometry consistent with normal pattern.  Please see scanned spirometry results for details.  Results discussed with patient/family.  Past Medical History: Patient Active Problem List   Diagnosis Date Noted   Chronic back pain 11/30/2020   Chronic knee pain 11/30/2020   Morbid obesity with BMI of 45.0-49.9, adult (HCC) 12/12/2019   Abnormal uterine bleeding (AUB) 12/12/2019   Nexplanon  in place 12/12/2019   Diabetes mellitus without complication (HCC)    Hypertension    Anxiety 03/30/2018   Depressive disorder 03/30/2018   Past Medical History:  Diagnosis Date   Allergy    Anxiety    Deafness in right ear    Depression    Diabetes mellitus  without complication (HCC)    Gestational diabetes 03/06/2013   glyburide    Hernia    Hypertension    Morbid obesity (HCC)    Plantar fasciitis    PONV (postoperative nausea and vomiting)    Sleep apnea    Past Surgical History: Past Surgical History:  Procedure Laterality Date   ABDOMINAL HERNIA REPAIR     CESAREAN SECTION     HERNIA REPAIR     Medication List:  Current Outpatient Medications  Medication Sig Dispense Refill   albuterol  (VENTOLIN  HFA) 108 (90 Base) MCG/ACT inhaler Inhale 1-2 puffs into the lungs every 6 (six) hours as needed for wheezing or shortness of breath. 18 g 0   ASPIRIN 81 PO Take by mouth.     cetirizine (ZYRTEC) 10 MG tablet Take by mouth.     diltiazem (CARDIZEM CD) 120 MG 24 hr capsule Take 120 mg by mouth daily.     docusate sodium  (COLACE) 100 MG capsule Take 100 mg by mouth 2 (two) times daily.     escitalopram  (LEXAPRO ) 10 MG tablet Take 1 tablet (10 mg total) by mouth daily. 90 tablet 1   levonorgestrel  (MIRENA ) 20 MCG/DAY IUD 1 each by Intrauterine route once.     losartan (COZAAR) 50 MG tablet Take by mouth.     meloxicam  (MOBIC ) 15 MG tablet Take 15 mg by mouth daily.     pravastatin (PRAVACHOL) 80 MG tablet Take 80 mg by mouth daily.  Probiotic Product (PROBIOTIC PO) Take by mouth.     promethazine -dextromethorphan (PROMETHAZINE -DM) 6.25-15 MG/5ML syrup Take 5 mLs by mouth 4 (four) times daily as needed for cough. 118 mL 0   Semaglutide (OZEMPIC, 0.25 OR 0.5 MG/DOSE, [place]) Inject into the skin.     topiramate (TOPAMAX) 50 MG tablet Take by mouth.     No current facility-administered medications for this visit.   Allergies: No Known Allergies Social History: Social History   Socioeconomic History   Marital status: Single    Spouse name: Not on file   Number of children: Not on file   Years of education: Not on file   Highest education level: Not on file  Occupational History   Occupation: LPN    Employer: [employer]  Tobacco Use    Smoking status: Never   Smokeless tobacco: Never  Vaping Use   Vaping status: Never Used  Substance and Sexual Activity   Alcohol use: Yes    Alcohol/week: 4.0 standard drinks of alcohol    Types: 4 Glasses of wine per week    Comment: socially   Drug use: No   Sexual activity: Yes    Partners: Male  Other Topics Concern   Not on file  Social History Narrative   Not on file   Social Drivers of Health   Financial Resource Strain: Low Risk  (10/02/2023)   Received from [facility]   Overall Financial Resource Strain (CARDIA)    Difficulty of Paying Living Expenses: Not very hard  Food Insecurity: No Food Insecurity (10/02/2023)   Received from [facility]   Hunger Vital Sign    Within the past 12 months, you worried that your food would run out before you got the money to buy more.: Never true    Within the past 12 months, the food you bought just didn't last and you didn't have money to get more.: Never true  Transportation Needs: No Transportation Needs (10/02/2023)   Received from [facility] - Transportation    Lack of Transportation (Medical): No    Lack of Transportation (Non-Medical): No  Physical Activity: Insufficiently Active (02/24/2023)   Received from [facility]   Exercise Vital Sign    On average, how many days per week do you engage in moderate to strenuous exercise (like a brisk walk)?: 3 days    On average, how many minutes do you engage in exercise at this level?: 20 min  Stress: Stress Concern Present (02/24/2023)   Received from [facility] - Occupational Stress Questionnaire    Feeling of Stress : To some extent  Social Connections: Moderately Integrated (02/24/2023)   Received from [facility]   Social Network    How would you rate your social network (family, work, friends)?: Adequate participation with social networks   Lives in a house. Smoking: denies Occupation: Technical sales engineer  HistorySurveyor, minerals in the house: no Engineer, civil (consulting) in the family room: yes Carpet in the bedroom: yes Heating: electric Cooling: central Pet: yes 1 turtle  Family History: Family History  Problem Relation Age of Onset   Hypothyroidism Mother    Hypertension Mother    Breast cancer Mother 73   Colon cancer Maternal Grandmother 66       colon   Dementia Maternal Grandfather    Problem  Relation Asthma                                   children Food allergy                          Sister   Review of Systems  Constitutional:  Negative for appetite change, chills, fever and unexpected weight change.  HENT:  Negative for congestion and rhinorrhea.   Eyes:  Negative for itching.  Respiratory:  Positive for shortness of breath. Negative for cough, chest tightness and wheezing.   Cardiovascular:  Negative for chest pain.  Gastrointestinal:  Positive for constipation. Negative for abdominal pain.  Genitourinary:  Negative for difficulty urinating.  Skin:  Positive for rash.  Neurological:  Negative for headaches.    Objective: BP 136/88   Pulse 83   Temp 98.6 F (37 C) (Temporal)   Resp 18   Ht 5' 6.25 (1.683 m)   Wt (!) 316 lb 12.8 oz (143.7 kg)   SpO2 98%   BMI 50.75 kg/m  Body mass index is 50.75 kg/m. Physical Exam Vitals and nursing note reviewed.  Constitutional:      Appearance: Normal appearance. She is well-developed. She is obese.  HENT:     Head: Normocephalic and atraumatic.     Right Ear: Tympanic membrane and external ear normal.     Left Ear: Tympanic membrane and external ear normal.     Nose: Nose normal.     Mouth/Throat:     Mouth: Mucous membranes are moist.     Pharynx: Oropharynx is clear.  Eyes:     Conjunctiva/sclera: Conjunctivae normal.  Cardiovascular:     Rate and Rhythm: Normal rate and regular rhythm.     Heart sounds: Normal heart sounds. No murmur heard.    No friction rub. No gallop.  Pulmonary:      Effort: Pulmonary effort is normal.     Breath sounds: Normal breath sounds. No wheezing, rhonchi or rales.  Musculoskeletal:     Cervical back: Neck supple.  Skin:    General: Skin is warm.     Findings: No rash.  Neurological:     Mental Status: She is alert and oriented to person, place, and time.  Psychiatric:        Behavior: Behavior normal.    The plan was reviewed with the patient/family, and all questions/concerned were addressed.  It was my pleasure to see Dannya today and participate in her care. Please feel free to contact me with any questions or concerns.  Sincerely,  Orlan Cramp, DO Allergy & Immunology  [facility]  [place] office: [phone] [facility] office: [phone]

## 2024-04-24 NOTE — Progress Notes (Unsigned)
 Skin testing note  RE: [name] MRN: [id number] DOB: 1981-08-27 Date of Office Visit: 04/25/2024  Referring provider: Rena Luke POUR, MD Primary care provider: Rena Luke POUR, MD  Chief Complaint: skin testing  History of Present Illness: I had the pleasure of seeing [name] for a skin testing visit at the [facility] on 04/25/2024. She is a 43 y.o. female, who is being followed for allergic rhinitis, urticaria, dyspnea on exertion. Her previous allergy office visit was on 04/11/2024 with Dr. Luke. Today is a skin testing visit.   Discussed the use of AI scribe software for clinical note transcription with the patient, who gave verbal consent to proceed.    She sometimes wakes up at night with anxiety and racing thoughts and inquires about the use of hydroxyzine  for anxiety and allergies.   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     Assessment and Plan: [name] is a 43 y.o. female with: Other allergic rhinitis Past history - perennial symptoms managed by daily zyrtec. No prior allergy testing.  Today's skin testing positive to grass, weed, trees, dog. Borderline to mold.  Start environmental control measures as below. Continue Zyrtec (cetirizine) 10mg  daily in the morning. May take hydroxyzine  25mg  1 hour before bedtime as needed.   Urticaria Past history - exercise-induced urticaria controlled with daily cetirizine. Concerned about other allergic triggers.  Today's skin testing positive to grass, weed, trees, dog. Borderline to mold. Negative to common foods.  Keep track of rashes and take pictures. Write down what you had done during flares.  Take zyrtec and hydroxyzine  as above.  Avoid the following potential triggers: alcohol, tight clothing, NSAIDs, hot showers and getting overheated. Continue proper skin care.   Dyspnea on exertion Past history - Shortness of breath with  exertion that resolves within 5-10 minutes without any intervention. Sometime uses albuterol  during infections. 2025 spirometry was normal. The shortness of breath is most likely due to physical deconditioning. During infections:  May use albuterol  rescue inhaler 2 puffs every 4 to 6 hours as needed for shortness of breath, chest tightness, coughing, and wheezing.  Monitor frequency of use - if you need to use it more than twice per week on a consistent basis let us  know.    Return in about 3 months (around 07/26/2024).  Meds ordered this encounter  Medications   hydrOXYzine  (ATARAX ) 25 MG tablet    Sig: Take 1 tablet (25 mg total) by mouth at bedtime as needed for anxiety or itching.    Dispense:  30 tablet    Refill:  3   Lab Orders  No laboratory test(s) ordered today    Diagnostics: Skin Testing: Environmental allergy panel and select foods. Today's skin testing positive to grass, weed, trees, dog. Borderline to mold.  Negative to common foods.  Results discussed with patient/family.  Airborne Adult Perc - 04/25/24 1026     Time Antigen Placed 1026    Allergen Manufacturer Jestine    Location Back    Number of Test 55    1. Control-Buffer 50% Glycerol Negative    2. Control-Histamine 3+    3. Bahia Negative    4. French Southern [place] 2+    5. Johnson --   +/-   6. Kentucky  Blue Negative    7. Meadow Fescue Negative    8. Perennial Rye Negative    9. [name] --   +/-  10. Ragweed Mix Negative    11. Cocklebur Negative    12. Plantain,  English 2+    13. Baccharis --   +/-   14. Dog Fennel --   +/-   15. Russian Thistle 2+    16. Lamb's Quarters Negative    17. Sheep Sorrell --   +/-   18. Rough Pigweed Negative    19. Marsh Elder, Rough Negative    20. Mugwort, Common Negative    21. Box, Elder Negative    22. Cedar, red Negative    23. Sweet Gum 2+    24. Pecan Pollen --   +/-   25. Pine Mix Negative    26. Walnut, Black Pollen Negative    27. Red Mulberry Negative     28. Ash Mix Negative    29. Birch Mix Negative    30. Beech American Negative    31. Cottonwood, Guinea-Bissau Negative    32. Hickory, White Negative    33. Maple Mix 2+    34. Oak, Guinea-[place] Mix 2+    35. Sycamore Eastern Negative    36. Alternaria Alternata Negative    37. Cladosporium Herbarum Negative    38. Aspergillus Mix Negative    39. Penicillium Mix Negative    40. Bipolaris Sorokiniana (Helminthosporium) Negative    41. Drechslera Spicifera (Curvularia) Negative    42. Mucor Plumbeus Negative    43. Fusarium Moniliforme Negative    44. Aureobasidium Pullulans (pullulara) --   +/-   45. Rhizopus Oryzae --   +/-   46. Botrytis Cinera Negative    47. Epicoccum Nigrum Negative    48. Phoma Betae Negative    49. Dust Mite Mix Negative    50. Cat Hair 10,000 BAU/ml Negative    51.  Dog Epithelia Negative    52. Mixed Feathers Negative    53. Horse Epithelia Negative    54. Cockroach, German Negative    55. Tobacco Leaf Negative          13 Food Perc - 04/25/24 1026       Test Information   Time Antigen Placed 1027    Allergen Manufacturer Jestine    Location Back    Number of allergen test 9      Food   1. Peanut Negative    2. Soybean Negative    3. Wheat Negative    4. Sesame Negative    5. Milk, Cow Negative    6. Casein Negative    7. Egg White, Chicken Negative    8. Shellfish Mix Negative    9. Fish Mix Negative          Intradermal - 04/25/24 1103     Time Antigen Placed 1103    Allergen Manufacturer Jestine    Location Arm    Number of Test 10    Control Negative    Bahia Negative    Ragweed Mix Negative    Mold 1 Negative    Mold 2 Negative    Mold 3 Negative    Mite Mix Negative    Cat Negative    Dog 2+    Cockroach Negative          Previous notes and tests were reviewed. The plan was reviewed with the patient/family, and all questions/concerned were addressed.  It was my pleasure to see [name] today and participate in her care.  Please feel free to contact me with any questions or concerns.  Sincerely,  Orlan Cramp, DO Allergy & Immunology  [facility]  [facility] office: [phone] [facility] office: [phone]

## 2024-04-25 ENCOUNTER — Encounter: Payer: Self-pay | Admitting: Allergy

## 2024-04-25 ENCOUNTER — Ambulatory Visit: Admitting: Allergy

## 2024-04-25 DIAGNOSIS — L508 Other urticaria: Secondary | ICD-10-CM

## 2024-04-25 DIAGNOSIS — L509 Urticaria, unspecified: Secondary | ICD-10-CM

## 2024-04-25 DIAGNOSIS — R0609 Other forms of dyspnea: Secondary | ICD-10-CM

## 2024-04-25 DIAGNOSIS — J3089 Other allergic rhinitis: Secondary | ICD-10-CM | POA: Diagnosis not present

## 2024-04-25 MED ORDER — HYDROXYZINE HCL 25 MG PO TABS: 25.0000 mg | ORAL_TABLET | Freq: Every evening | ORAL | 3 refills | Status: DC | PRN

## 2024-04-25 NOTE — Patient Instructions (Addendum)
 Today's skin testing positive to grass, weed, trees, dog. Borderline to mold.  Negative to common foods.   Results given.  Environmental allergies Start environmental control measures as below. Continue Zyrtec (cetirizine) 10mg  daily in the morning. May take hydroxyzine  25mg  1 hour before bedtime as needed.   Hives  Keep track of rashes and take pictures. Write down what you had done during flares.  Take zyrtec and hydroxyzine  as above.  Avoid the following potential triggers: alcohol, tight clothing, NSAIDs, hot showers and getting overheated. Continue proper skin care.   Shortness of breath Sounds like your shortness of breath with exertion is mainly due to physical deconditioning.  During infections:  May use albuterol  rescue inhaler 2 puffs every 4 to 6 hours as needed for shortness of breath, chest tightness, coughing, and wheezing.  Monitor frequency of use - if you need to use it more than twice per week on a consistent basis let us  know.   Follow up in 3 months or sooner if needed.  Reducing Pollen Exposure Pollen seasons: trees (spring), grass (summer) and ragweed/weeds (fall). Keep windows closed in your home and car to lower pollen exposure.  Install air conditioning in the bedroom and throughout the house if possible.  Avoid going out in dry windy days - especially early morning. Pollen counts are highest between 5 - 10 AM and on dry, hot and windy days.  Save outside activities for late afternoon or after a heavy rain, when pollen levels are lower.  Avoid mowing of grass if you have grass pollen allergy. Be aware that pollen can also be transported indoors on people and pets.  Dry your clothes in an automatic dryer rather than hanging them outside where they might collect pollen.  Rinse hair and eyes before bedtime.  Pet Allergen Avoidance: Contrary to popular opinion, there are no "hypoallergenic" breeds of dogs or cats. That is because people are not allergic to  an animal's hair, but to an allergen found in the animal's saliva, dander (dead skin flakes) or urine. Pet allergy symptoms typically occur within minutes. For some people, symptoms can build up and become most severe 8 to 12 hours after contact with the animal. People with severe allergies can experience reactions in public places if dander has been transported on the pet owners' clothing. Keeping an animal outdoors is only a partial solution, since homes with pets in the yard still have higher concentrations of animal allergens. Before getting a pet, ask your allergist to determine if you are allergic to animals. If your pet is already considered part of your family, try to minimize contact and keep the pet out of the bedroom and other rooms where you spend a great deal of time. As with dust mites, vacuum carpets often or replace carpet with a hardwood floor, tile or linoleum. High-efficiency particulate air (HEPA) cleaners can reduce allergen levels over time. While dander and saliva are the source of cat and dog allergens, urine is the source of allergens from rabbits, hamsters, mice and israel pigs; so ask a non-allergic family member to clean the animal's cage. If you have a pet allergy, talk to your allergist about the potential for allergy immunotherapy (allergy shots). This strategy can often provide long-term relief.  Mold Control Mold and fungi can grow on a variety of surfaces provided certain temperature and moisture conditions exist.  Outdoor molds grow on plants, decaying vegetation and soil. The major outdoor mold, Alternaria and Cladosporium, are found in very high numbers  during hot and dry conditions. Generally, a late summer - fall peak is seen for common outdoor fungal spores. Rain will temporarily lower outdoor mold spore count, but counts rise rapidly when the rainy period ends. The most important indoor molds are Aspergillus and Penicillium. Dark, humid and poorly ventilated  basements are ideal sites for mold growth. The next most common sites of mold growth are the bathroom and the kitchen. Outdoor (Seasonal) Mold Control Use air conditioning and keep windows closed. Avoid exposure to decaying vegetation. Avoid leaf raking. Avoid grain handling. Consider wearing a face mask if working in moldy areas.  Indoor (Perennial) Mold Control  Maintain humidity below 50%. Get rid of mold growth on hard surfaces with water, detergent and, if necessary, 5% bleach (do not mix with other cleaners). Then dry the area completely. If mold covers an area more than 10 square feet, consider hiring an indoor environmental professional. For clothing, washing with soap and water is best. If moldy items cannot be cleaned and dried, throw them away. Remove sources e.g. contaminated carpets. Repair and seal leaking roofs or pipes. Using dehumidifiers in damp basements may be helpful, but empty the water and clean units regularly to prevent mildew from forming. All rooms, especially basements, bathrooms and kitchens, require ventilation and cleaning to deter mold and mildew growth. Avoid carpeting on concrete or damp floors, and storing items in damp areas.   Skin care recommendations  Bath time: Always use lukewarm water. AVOID very hot or cold water. Keep bathing time to 5-10 minutes. Do NOT use bubble bath. Use a mild soap and use just enough to wash the dirty areas. Do NOT scrub skin vigorously.  After bathing, pat dry your skin with a towel. Do NOT rub or scrub the skin.  Moisturizers and prescriptions:  ALWAYS apply moisturizers immediately after bathing (within 3 minutes). This helps to lock-in moisture. Use the moisturizer several times a day over the whole body. Good summer moisturizers include: Aveeno, CeraVe, Cetaphil. Good winter moisturizers include: Aquaphor, Vaseline, Cerave, Cetaphil, Eucerin, Vanicream. When using moisturizers along with medications, the moisturizer  should be applied about one hour after applying the medication to prevent diluting effect of the medication or moisturize around where you applied the medications. When not using medications, the moisturizer can be continued twice daily as maintenance.  Laundry and clothing: Avoid laundry products with added color or perfumes. Use unscented hypo-allergenic laundry products such as Tide free, Cheer free & gentle, and All free and clear.  If the skin still seems dry or sensitive, you can try double-rinsing the clothes. Avoid tight or scratchy clothing such as wool. Do not use fabric softeners or dyer sheets.

## 2024-05-17 ENCOUNTER — Other Ambulatory Visit: Payer: Self-pay | Admitting: Allergy

## 2024-05-21 ENCOUNTER — Telehealth: Payer: Self-pay | Admitting: *Deleted

## 2024-05-21 NOTE — Telephone Encounter (Signed)
 Patient left a message and is requesting a year refill of Lexapro . Please contact patient.

## 2024-05-21 NOTE — Telephone Encounter (Signed)
 Patient advised to call pharmacy for refill request. Correct fax number given for [facility].

## 2024-05-28 ENCOUNTER — Other Ambulatory Visit: Payer: Self-pay

## 2024-05-28 ENCOUNTER — Encounter: Payer: Self-pay | Admitting: Sports Medicine

## 2024-05-28 DIAGNOSIS — F32A Depression, unspecified: Secondary | ICD-10-CM

## 2024-05-28 DIAGNOSIS — F419 Anxiety disorder, unspecified: Secondary | ICD-10-CM

## 2024-05-30 MED ORDER — ESCITALOPRAM OXALATE 10 MG PO TABS
10.0000 mg | ORAL_TABLET | Freq: Every day | ORAL | 0 refills | Status: AC
  Filled 2024-07-22: qty 60, 60d supply, fill #0
  Filled 2024-08-09: qty 30, 30d supply, fill #0
  Filled 2024-10-10: qty 30, 30d supply, fill #1

## 2024-07-04 DIAGNOSIS — H52221 Regular astigmatism, right eye: Secondary | ICD-10-CM | POA: Diagnosis not present

## 2024-07-04 DIAGNOSIS — H524 Presbyopia: Secondary | ICD-10-CM | POA: Diagnosis not present

## 2024-07-04 DIAGNOSIS — H5202 Hypermetropia, left eye: Secondary | ICD-10-CM | POA: Diagnosis not present

## 2024-07-22 ENCOUNTER — Other Ambulatory Visit (HOSPITAL_COMMUNITY): Payer: Self-pay

## 2024-07-22 ENCOUNTER — Other Ambulatory Visit: Payer: Self-pay

## 2024-07-22 MED ORDER — LOSARTAN POTASSIUM 50 MG PO TABS
25.0000 mg | ORAL_TABLET | Freq: Every day | ORAL | 0 refills | Status: AC
  Filled 2024-07-22: qty 45, 90d supply, fill #0
  Filled 2024-08-09: qty 15, 30d supply, fill #0
  Filled 2024-10-10: qty 15, 30d supply, fill #1

## 2024-07-22 MED ORDER — ZEPBOUND 7.5 MG/0.5ML ~~LOC~~ SOAJ
7.5000 mg | SUBCUTANEOUS | 0 refills | Status: DC
  Filled 2024-07-22: qty 6, 84d supply, fill #0

## 2024-07-22 MED ORDER — FLUZONE 0.5 ML IM SUSY
PREFILLED_SYRINGE | INTRAMUSCULAR | 0 refills | Status: AC
  Filled 2024-07-22: qty 0.5, 1d supply, fill #0

## 2024-07-22 MED ORDER — ASPIRIN 81 MG PO TBEC
81.0000 mg | DELAYED_RELEASE_TABLET | Freq: Every day | ORAL | 3 refills | Status: AC
  Filled 2024-07-22: qty 90, 90d supply, fill #0
  Filled 2024-08-09: qty 30, 30d supply, fill #0

## 2024-07-22 MED ORDER — DILTIAZEM HCL ER 120 MG PO CP24
120.0000 mg | ORAL_CAPSULE | Freq: Every day | ORAL | 1 refills | Status: AC
  Filled 2024-07-22: qty 90, 90d supply, fill #0
  Filled 2024-08-09: qty 30, 30d supply, fill #0
  Filled 2024-10-10: qty 30, 30d supply, fill #1

## 2024-07-22 MED FILL — Hydroxyzine HCl Tab 25 MG: ORAL | 90 days supply | Qty: 90 | Fill #0 | Status: CN

## 2024-07-25 DIAGNOSIS — F341 Dysthymic disorder: Secondary | ICD-10-CM | POA: Diagnosis not present

## 2024-07-31 DIAGNOSIS — F411 Generalized anxiety disorder: Secondary | ICD-10-CM | POA: Diagnosis not present

## 2024-08-02 ENCOUNTER — Other Ambulatory Visit (HOSPITAL_COMMUNITY): Payer: Self-pay

## 2024-08-09 ENCOUNTER — Other Ambulatory Visit (HOSPITAL_COMMUNITY): Payer: Self-pay

## 2024-08-09 MED FILL — Hydroxyzine HCl Tab 25 MG: ORAL | 30 days supply | Qty: 30 | Fill #0 | Status: AC

## 2024-08-13 ENCOUNTER — Encounter: Payer: Self-pay | Admitting: Family Medicine

## 2024-08-13 ENCOUNTER — Ambulatory Visit: Payer: Self-pay | Admitting: Family Medicine

## 2024-08-13 VITALS — BP 151/83 | HR 81 | Ht 66.0 in | Wt 313.0 lb

## 2024-08-13 DIAGNOSIS — F419 Anxiety disorder, unspecified: Secondary | ICD-10-CM

## 2024-08-13 DIAGNOSIS — R7303 Prediabetes: Secondary | ICD-10-CM

## 2024-08-13 DIAGNOSIS — Z6841 Body Mass Index (BMI) 40.0 and over, adult: Secondary | ICD-10-CM

## 2024-08-13 DIAGNOSIS — E66813 Obesity, class 3: Secondary | ICD-10-CM

## 2024-08-13 DIAGNOSIS — I10 Essential (primary) hypertension: Secondary | ICD-10-CM | POA: Diagnosis not present

## 2024-08-13 NOTE — Progress Notes (Signed)
 Office: [phone]  /  Fax: [phone]   Initial Visit  [name] was seen in clinic today to evaluate for obesity. She is interested in losing weight to improve overall health and reduce the risk of weight related complications. She presents today to review program treatment options, initial physical assessment, and evaluation.     She was referred by: Specialist  When asked what else they would like to accomplish? She states: Adopt a healthier eating pattern and lifestyle, Improve energy levels and physical activity, Improve existing medical conditions, Reduce number of medications, and Improve quality of life She would like to be <250 lb She did consider WLS earlier this year  Weight history:  she was doing well on Ozempic but had hemorrohoids from higher doses.  Changed  to Mounjaro - took 8 months without weight loss.  She has had prediabetes.   She is working at [employer] more Lives with kids, 11 and 13 and her boyfriend   When asked how has your weight affected you? She states: Contributed to medical problems and Having fatigue  Some associated conditions: Hypertension, Hyperlipidemia, OSA, Prediabetes, Insulin Resistance, and Other: plantar fasciitis  Contributing factors: moderate to high levels of stress  Weight promoting medications identified: None  Current nutrition plan: None  Current level of physical activity: NEAT Knees hurt with steps  Current or previous pharmacotherapy: GLP-1, Phentermine, and Topiramate  Response to medication: she had stimulant SE from Phentermine, not sure with Qsymia,    Past medical history includes:   Past Medical History:  Diagnosis Date   Allergy     Anxiety    Deafness in right ear    Depression    Diabetes mellitus without complication (HCC)    Gestational diabetes 03/06/2013   glyburide    Hernia    Hypertension    Morbid obesity (HCC)    Plantar fasciitis    PONV (postoperative nausea and vomiting)     Sleep apnea      Objective:   BP (!) 151/83   Pulse 81   Ht 5' 6 (1.676 m)   Wt (!) 313 lb (142 kg)   SpO2 99%   BMI 50.52 kg/m  She was weighed on the bioimpedance scale: Body mass index is 50.52 kg/m.  Peak Weight:330 , Body Fat%:55.8, Visceral Fat Rating:20, Weight trend over the last 12 months: Increasing  General:  Alert, oriented and cooperative. Patient is in no acute distress.  Respiratory: Normal respiratory effort, no problems with respiration noted   Gait: able to ambulate independently  Mental Status: Normal mood and affect. Normal behavior. Normal judgment and thought content.   DIAGNOSTIC DATA REVIEWED:  BMET    Component Value Date/Time   NA 138 08/16/2021 0821   K 4.4 08/16/2021 0821   CL 107 08/16/2021 0821   CO2 23 08/16/2021 0821   GLUCOSE 106 (H) 08/16/2021 0821   GLUCOSE 94 02/28/2013 0837   BUN 9 08/16/2021 0821   CREATININE 0.67 08/16/2021 0821   CALCIUM 9.1 08/16/2021 0821   GFRNONAA >60 08/16/2021 0821   GFRAA >90 07/25/2013 1800   No results found for: HGBA1C No results found for: INSULIN CBC    Component Value Date/Time   WBC 15.6 (H) 08/16/2021 0821   RBC 4.11 08/16/2021 0821   HGB 12.1 08/16/2021 0821   HCT 37.4 08/16/2021 0821   PLT 274 08/16/2021 0821   MCV 91.0 08/16/2021 0821   MCH 29.4 08/16/2021 0821   MCHC 32.4 08/16/2021 0821  RDW 13.1 08/16/2021 0821   Iron/TIBC/Ferritin/ %Sat No results found for: IRON, TIBC, FERRITIN, IRONPCTSAT Lipid Panel  No results found for: CHOL, TRIG, HDL, CHOLHDL, VLDL, LDLCALC, LDLDIRECT Hepatic Function Panel     Component Value Date/Time   PROT 6.9 08/16/2021 0821   ALBUMIN 3.9 08/16/2021 0821   AST 12 (L) 08/16/2021 0821   ALT 7 08/16/2021 0821   ALKPHOS 62 08/16/2021 0821   BILITOT 0.7 08/16/2021 0821      Component Value Date/Time   TSH 3.02 12/12/2019 1327     Assessment and Plan:   Prediabetes  Class 3 severe obesity due to excess  calories with body mass index (BMI) of 50.0 to 59.9 in adult, unspecified whether serious comorbidity present (HCC)  Anxiety and depression  Primary hypertension        Obesity Treatment / Action Plan:  Patient will work on garnering support from family and friends to begin weight loss journey. Will work on eliminating or reducing the presence of highly palatable, calorie dense foods in the home. Will complete provided nutritional and psychosocial assessment questionnaire before the next appointment. Will be scheduled for indirect calorimetry to determine resting energy expenditure in a fasting state.  This will allow us  to create a reduced calorie, high-protein meal plan to promote loss of fat mass while preserving muscle mass. Will think about ideas on how to incorporate physical activity into their daily routine. Was counseled on nutritional approaches to weight loss and benefits of reducing processed foods and consuming plant-based foods and high quality protein as part of nutritional weight management. Was counseled on pharmacotherapy and role as an adjunct in weight management.   Obesity Education Performed Today:  She was weighed on the bioimpedance scale and results were discussed and documented in the synopsis.  We discussed obesity as a disease and the importance of a more detailed evaluation of all the factors contributing to the disease.  We discussed the importance of long term lifestyle changes which include nutrition, exercise and behavioral modifications as well as the importance of customizing this to her specific health and social needs.  We discussed the benefits of reaching a healthier weight to alleviate the symptoms of existing conditions and reduce the risks of the biomechanical, metabolic and psychological effects of obesity.  [name] appears to be in the action stage of change and states they are ready to start intensive lifestyle modifications and  behavioral modifications.  24 minutes was spent today on this visit including the above counseling, pre-visit chart review, and post-visit documentation.  Reviewed by clinician on day of visit: allergies, medications, problem list, medical history, surgical history, family history, social history, and previous encounter notes pertinent to obesity diagnosis.    Darice Haddock, D.O. DABFM, [facility] [address] [place], [place] [address] [phone]

## 2024-08-14 ENCOUNTER — Telehealth: Payer: Self-pay | Admitting: *Deleted

## 2024-08-14 NOTE — Telephone Encounter (Signed)
 Left patient a message to call and schedule possible left vaginal cyst with an MD.

## 2024-08-20 DIAGNOSIS — F411 Generalized anxiety disorder: Secondary | ICD-10-CM | POA: Diagnosis not present

## 2024-08-29 ENCOUNTER — Encounter: Payer: Self-pay | Admitting: Orthopedic Surgery

## 2024-08-29 ENCOUNTER — Ambulatory Visit: Admitting: Orthopedic Surgery

## 2024-08-29 VITALS — BP 130/80 | Ht 66.0 in | Wt 315.0 lb

## 2024-08-29 DIAGNOSIS — M170 Bilateral primary osteoarthritis of knee: Secondary | ICD-10-CM | POA: Diagnosis not present

## 2024-08-29 DIAGNOSIS — M1712 Unilateral primary osteoarthritis, left knee: Secondary | ICD-10-CM

## 2024-08-29 DIAGNOSIS — M1711 Unilateral primary osteoarthritis, right knee: Secondary | ICD-10-CM

## 2024-08-29 MED ORDER — METHYLPREDNISOLONE ACETATE 40 MG/ML IJ SUSP
40.0000 mg | Freq: Once | INTRAMUSCULAR | Status: AC
  Administered 2024-08-29: 40 mg via INTRA_ARTICULAR

## 2024-08-29 NOTE — Addendum Note (Signed)
 Addended byBETHA JENEAN GREIG LELON on: 08/29/2024 03:28 PM   Modules accepted: Orders

## 2024-08-29 NOTE — Patient Instructions (Signed)
 You have arthritis of a major joint  The recommended treatment for pain is:  Weight loss   Tylenol  500 mg every 6 hours  Plus or minus an anti-inflammatory such as Advil , Aleve  or prescription Celebrex or meloxicam  (you can't take these)  You can use topical medication such as Bengay, Aspercreme, Biofreeze, Voltaren gel  You can apply ice or heat for 20 minutes depending on which one feels better  Oral supplements : chondroitin sulfate and glucosamine can be helpful as well.  Exercise quadricep strengthening exercises 3 times a week  Steroid Injections can be helpful for short term pain relief   Opioids such as codeine, hydrocodone, oxycodone  are not indicated for arthritis pain

## 2024-08-29 NOTE — Progress Notes (Addendum)
  Intake history:  Chief Complaint  Patient presents with   Knee Pain    Both knees      BP 130/80 Comment: patient checks at home, runs 130/80  Ht 5' 6 (1.676 m)   Wt (!) 315 lb (142.9 kg)   BMI 50.84 kg/m  Body mass index is 50.84 kg/m.  Pharmacy? ______[facility] ________________________________  WHAT ARE WE SEEING YOU FOR TODAY?   Both knees   How long has this bothered you? (DOI?DOS?WS?)  13 years / wants to walk more but they bother her   Was there an injury? No  Anticoag.  No   Any ALLERGIES ___________NKDA___________________________________   Treatment:  Have you taken:  Tylenol  Yes  Advil  Yes  Had PT No  Had injection Yes  Other  _________________________

## 2024-08-29 NOTE — Addendum Note (Signed)
 Addended byBETHA JENEAN GREIG LELON on: 08/29/2024 02:53 PM   Modules accepted: Orders
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
## 2024-09-10 ENCOUNTER — Ambulatory Visit: Admitting: Family Medicine

## 2024-09-13 DIAGNOSIS — F411 Generalized anxiety disorder: Secondary | ICD-10-CM | POA: Diagnosis not present

## 2024-09-24 ENCOUNTER — Ambulatory Visit: Admitting: Family Medicine

## 2024-10-07 ENCOUNTER — Encounter: Payer: Self-pay | Admitting: Family Medicine

## 2024-10-07 ENCOUNTER — Ambulatory Visit: Admitting: Family Medicine

## 2024-10-07 VITALS — BP 136/84 | HR 81 | Temp 97.8°F | Ht 66.0 in | Wt 310.0 lb

## 2024-10-07 DIAGNOSIS — Z6841 Body Mass Index (BMI) 40.0 and over, adult: Secondary | ICD-10-CM | POA: Diagnosis not present

## 2024-10-07 DIAGNOSIS — Z1331 Encounter for screening for depression: Secondary | ICD-10-CM | POA: Diagnosis not present

## 2024-10-07 DIAGNOSIS — R5383 Other fatigue: Secondary | ICD-10-CM | POA: Diagnosis not present

## 2024-10-07 DIAGNOSIS — G4733 Obstructive sleep apnea (adult) (pediatric): Secondary | ICD-10-CM

## 2024-10-07 DIAGNOSIS — F32A Depression, unspecified: Secondary | ICD-10-CM | POA: Diagnosis not present

## 2024-10-07 DIAGNOSIS — R7303 Prediabetes: Secondary | ICD-10-CM | POA: Diagnosis not present

## 2024-10-07 DIAGNOSIS — R0602 Shortness of breath: Secondary | ICD-10-CM | POA: Diagnosis not present

## 2024-10-07 DIAGNOSIS — E88819 Insulin resistance, unspecified: Secondary | ICD-10-CM

## 2024-10-07 DIAGNOSIS — E559 Vitamin D deficiency, unspecified: Secondary | ICD-10-CM

## 2024-10-07 NOTE — Progress Notes (Signed)
 "  At a Glance:  Vitals Temp: 97.8 F (36.6 C) BP: 136/84 Pulse Rate: 81 SpO2: 100 %   Anthropometric Measurements Height: 5' 6 (1.676 m) Weight: (!) 310 lb (140.6 kg) BMI (Calculated): 50.06 Starting Weight: 310lb Peak Weight: 330lb   Body Composition  Body Fat %: 54.7 % Fat Mass (lbs): 169.8 lbs Muscle Mass (lbs): 133.6 lbs Total Body Water (lbs): 105.2 lbs Visceral Fat Rating : 19   Other Clinical Data RMR: 2002 Fasting: Yes Labs: Yes Today's Visit #: 1 Starting Date: 10/07/24    EKG: Normal sinus rhythm, rate 77.  Indirect Calorimeter completed today shows a VO2 of 291 and a REE of 2002.  Her calculated basal metabolic rate is 7922 thus her basal metabolic rate is worse than expected.  Chief Complaint:  Obesity   Subjective:  [name] (MR# [id number]) is a 44 y.o. female who presents for evaluation and treatment of obesity and related comorbidities.   [name] is currently in the action stage of change and ready to dedicate time achieving and maintaining a healthier weight. [name] is interested in becoming our patient and working on intensive lifestyle modifications including (but not limited to) diet and exercise for weight loss.  [name] has been struggling with her weight. She has been unsuccessful in either losing weight, maintaining weight loss, or reaching her healthy weight goal.  She was seen by me in 2022 in 2023 for medically supervised weight management with some success on Ozempic 2 mg once weekly injection and some success with phentermine but discontinued due to insomnia side effects.  She has consider going the route of weight loss surgery but did not proceed due to inability to follow recommended dietary changes.  [name]'s habits were reviewed today and are as follows: her desired weight loss is over 100 pounds, she started gaining weight in middle school and gained more weight following her first pregnancy. she has significant food  cravings issues, she snacks frequently in the evenings, she frequently makes poor food choices, she has problems with excessive hunger, she frequently eats larger portions than normal, and she struggles with emotional eating.  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  She lives at home with her 2 kids and a friend.  She has tried multiple different antiobesity medications in the years and her weight has been around 300 pounds for over 5 years.  She is currently not using her CPAP, previously managed by [facility].  She reports fighting with her mask at night.  Other Fatigue [name] admits to daytime somnolence and admits to waking up still tired. Patient has a history of symptoms of daytime fatigue. [name] generally gets 5 or 6 hours of sleep per night, and states that she has nightime awakenings. Snoring is present. Apneic episodes are present. Epworth Sleepiness Score is 7.   Shortness of Breath [name] notes increasing shortness of breath with exercising and seems to be worsening over time with weight gain. She notes getting out of breath sooner with activity than she used to. This has gotten worse recently. [name] denies shortness of breath at rest or orthopnea.   Depression Screen [name]'s Food and Mood (modified PHQ-9) score was 11.     10/07/2024    8:39 AM  Depression screen PHQ 2/9  Decreased Interest 1  Down, Depressed, Hopeless 2  PHQ - 2 Score 3  Altered sleeping 2  Tired, decreased  energy 2  Change in appetite 0  Feeling bad or failure about yourself  0  Trouble concentrating 2  Moving slowly or fidgety/restless 0  Suicidal thoughts 0  PHQ-9 Score 9     Assessment and Plan:   Other Fatigue [name] does feel that her weight is causing her energy to be lower than it should be. Fatigue may be related to obesity, depression or many other causes. Labs will be ordered, and in the meanwhile, Moni will  focus on self care including making healthy food choices, increasing physical activity and focusing on stress reduction.  Shortness of Breath [name] does feel that she gets out of breath more easily that she used to when she exercises. [name]'s shortness of breath appears to be obesity related and exercise induced. She has agreed to work on weight loss and gradually increase exercise to treat her exercise induced shortness of breath. Will continue to monitor closely.  [name] had a positive depression screening. Depression is commonly associated with obesity and often results in emotional eating behaviors. We will monitor this closely and work on CBT to help improve the non-hunger eating patterns. Referral to Psychology may be required if no improvement is seen as she continues in our clinic.    Problem List Items Addressed This Visit     Depressive disorder Continue Lexapro  10 mg once daily, prescribed by OB/GYN. Consider adding in CBT to work on emotional eating behaviors    OSA (obstructive sleep apnea) She reports poor compliance using CPAP at night due to fighting with her mask.  She has never tried nasal pillows.  Previously managed by [facility] sleep [facility].  Will move over to [facility] pulmonology for insurance changes.  Continue active plan for weight reduction.  Work on scientist, research (life sciences).   Relevant Orders   Ambulatory referral to Pulmonology   Vitamin D  deficiency Last vitamin D  No results found for: 25OHVITD2, 25OHVITD3, VD25OH She is currently not on a vitamin D  supplement but has previously required additional vitamin D .  Check labs today.   Other Visit Diagnoses       SOBOE (shortness of breath on exertion)    -  Primary     Other fatigue       Relevant Orders   EKG 12-Lead (Completed)   VITAMIN D  25 Hydroxy (Vit-D Deficiency, Fractures)   TSH   T4, free   T3   Folate   Comprehensive metabolic panel with GFR   Vitamin B12   CBC with  Differential/Platelet     Depression screen         Prediabetes   No results found for: HGBA1C History of prediabetes.  She has used off-label Ozempic and Mounjaro  for prediabetes with no history of type 2 diabetes.  She failed to see any weight loss on Mounjaro .  She did well on Ozempic with some GI side effect due to dietary nonadherence.  She had short-term use of metformin with some diarrhea.      Insulin  resistance     Begin prescribed dietary change along with tracking daily steps at work.   Relevant Orders   Insulin , random   Hemoglobin A1c     Morbid obesity (HCC)         BMI 50.0-59.9, adult (HCC)           Serine is currently in the action stage of change and her goal is to continue with weight loss efforts. I recommend Dabney begin the structured treatment plan as  follows:  She has agreed to Category 3 Plan +100 additional snack calories  Exercise goals: All adults should avoid inactivity. Some activity is better than none, and adults who participate in any amount of physical activity, gain some health benefits.  Behavioral modification strategies:increasing lean protein intake, decreasing simple carbohydrates, increase H2O intake, decrease liquid calories, decreasing eating out, no skipping meals, meal planning and cooking strategies, keeping healthy foods in the home, better snacking choices, and decrease junk food   She was informed of the importance of frequent follow-up visits to maximize her success with intensive lifestyle modifications for her multiple health conditions. She was informed we would discuss her lab results at her next visit unless there is a critical issue that needs to be addressed sooner. Traniyah agreed to keep her next visit at the agreed upon time to discuss these results.  Objective:  General: Cooperative, alert, well developed, in no acute distress. HEENT: Conjunctivae and lids unremarkable. Cardiovascular: Regular rhythm.  Lungs: Normal  work of breathing. Neurologic: No focal deficits.   Lab Results  Component Value Date   CREATININE 0.67 08/16/2021   BUN 9 08/16/2021   NA 138 08/16/2021   K 4.4 08/16/2021   CL 107 08/16/2021   CO2 23 08/16/2021   Lab Results  Component Value Date   ALT 7 08/16/2021   AST 12 (L) 08/16/2021   ALKPHOS 62 08/16/2021   BILITOT 0.7 08/16/2021   No results found for: HGBA1C No results found for: INSULIN  Lab Results  Component Value Date   TSH 3.02 12/12/2019   No results found for: CHOL, HDL, LDLCALC, LDLDIRECT, TRIG, CHOLHDL Lab Results  Component Value Date   WBC 15.6 (H) 08/16/2021   HGB 12.1 08/16/2021   HCT 37.4 08/16/2021   MCV 91.0 08/16/2021   PLT 274 08/16/2021   No results found for: IRON, TIBC, FERRITIN  Attestation Statements:  Reviewed by clinician on day of visit: allergies, medications, problem list, medical history, surgical history, family history, social history, and previous encounter notes.  Time spent on visit including pre-visit chart review and post-visit charting and face- to face care including nutritional counseling, review of EKG, interpretation of body composition scale and indirect calorimetry and nutrition prescription  was 42 minutes.   Darice Haddock, D.O. DABFM, DABOM [facility] [address] [place], [place] [address] [phone]  "

## 2024-10-08 ENCOUNTER — Ambulatory Visit: Payer: Self-pay | Admitting: Family Medicine

## 2024-10-08 ENCOUNTER — Ambulatory Visit: Admitting: Family Medicine

## 2024-10-08 LAB — COMPREHENSIVE METABOLIC PANEL WITH GFR
ALT: 7 IU/L (ref 0–32)
AST: 13 IU/L (ref 0–40)
Albumin: 3.9 g/dL (ref 3.9–4.9)
Alkaline Phosphatase: 83 IU/L (ref 41–116)
BUN/Creatinine Ratio: 17 (ref 9–23)
BUN: 12 mg/dL (ref 6–24)
Bilirubin Total: 0.5 mg/dL (ref 0.0–1.2)
CO2: 24 mmol/L (ref 20–29)
Calcium: 8.9 mg/dL (ref 8.7–10.2)
Chloride: 105 mmol/L (ref 96–106)
Creatinine, Ser: 0.71 mg/dL (ref 0.57–1.00)
Globulin, Total: 2.7 g/dL (ref 1.5–4.5)
Glucose: 91 mg/dL (ref 70–99)
Potassium: 4.5 mmol/L (ref 3.5–5.2)
Sodium: 144 mmol/L (ref 134–144)
Total Protein: 6.6 g/dL (ref 6.0–8.5)
eGFR: 109 mL/min/1.73

## 2024-10-08 LAB — VITAMIN B12: Vitamin B-12: 754 pg/mL (ref 232–1245)

## 2024-10-08 LAB — CBC WITH DIFFERENTIAL/PLATELET
Basophils Absolute: 0.1 x10E3/uL (ref 0.0–0.2)
Basos: 1 %
EOS (ABSOLUTE): 0.2 x10E3/uL (ref 0.0–0.4)
Eos: 1 %
Hematocrit: 38.3 % (ref 34.0–46.6)
Hemoglobin: 12.3 g/dL (ref 11.1–15.9)
Immature Grans (Abs): 0 x10E3/uL (ref 0.0–0.1)
Immature Granulocytes: 0 %
Lymphocytes Absolute: 3.4 x10E3/uL — ABNORMAL HIGH (ref 0.7–3.1)
Lymphs: 25 %
MCH: 30.1 pg (ref 26.6–33.0)
MCHC: 32.1 g/dL (ref 31.5–35.7)
MCV: 94 fL (ref 79–97)
Monocytes Absolute: 0.9 x10E3/uL (ref 0.1–0.9)
Monocytes: 7 %
Neutrophils Absolute: 8.8 x10E3/uL — ABNORMAL HIGH (ref 1.4–7.0)
Neutrophils: 66 %
Platelets: 343 x10E3/uL (ref 150–450)
RBC: 4.08 x10E6/uL (ref 3.77–5.28)
RDW: 12.4 % (ref 11.7–15.4)
WBC: 13.3 x10E3/uL — ABNORMAL HIGH (ref 3.4–10.8)

## 2024-10-08 LAB — T3: T3, Total: 126 ng/dL (ref 71–180)

## 2024-10-08 LAB — TSH: TSH: 6.62 u[IU]/mL — ABNORMAL HIGH (ref 0.450–4.500)

## 2024-10-08 LAB — FOLATE: Folate: 6.9 ng/mL

## 2024-10-08 LAB — HEMOGLOBIN A1C
Est. average glucose Bld gHb Est-mCnc: 123 mg/dL
Hgb A1c MFr Bld: 5.9 % — ABNORMAL HIGH (ref 4.8–5.6)

## 2024-10-08 LAB — T4, FREE: Free T4: 1.02 ng/dL (ref 0.82–1.77)

## 2024-10-08 LAB — INSULIN, RANDOM: INSULIN: 20.7 u[IU]/mL (ref 2.6–24.9)

## 2024-10-08 LAB — VITAMIN D 25 HYDROXY (VIT D DEFICIENCY, FRACTURES): Vit D, 25-Hydroxy: 23 ng/mL — ABNORMAL LOW (ref 30.0–100.0)

## 2024-10-08 LAB — SPECIMEN STATUS REPORT

## 2024-10-10 ENCOUNTER — Other Ambulatory Visit (HOSPITAL_COMMUNITY): Payer: Self-pay

## 2024-10-10 MED FILL — Hydroxyzine HCl Tab 25 MG: ORAL | 30 days supply | Qty: 30 | Fill #1 | Status: CN

## 2024-10-11 ENCOUNTER — Other Ambulatory Visit (HOSPITAL_COMMUNITY): Payer: Self-pay

## 2024-10-11 ENCOUNTER — Encounter (HOSPITAL_COMMUNITY): Payer: Self-pay

## 2024-10-11 ENCOUNTER — Other Ambulatory Visit: Payer: Self-pay

## 2024-10-11 ENCOUNTER — Ambulatory Visit: Admitting: Family Medicine

## 2024-10-21 ENCOUNTER — Other Ambulatory Visit (HOSPITAL_COMMUNITY): Payer: Self-pay

## 2024-10-21 ENCOUNTER — Ambulatory Visit: Admitting: Family Medicine

## 2024-11-18 ENCOUNTER — Ambulatory Visit: Admitting: Primary Care

## 2024-11-21 ENCOUNTER — Ambulatory Visit: Admitting: Family Medicine

## 2024-11-28 ENCOUNTER — Ambulatory Visit: Admitting: Orthopedic Surgery
